# Patient Record
Sex: Female | Born: 1950 | Race: White | Hispanic: No | Marital: Single | Smoking: Never smoker
Health system: Southern US, Community
[De-identification: ages and names within clinical notes are randomized; demographics above are authoritative.]

## PROBLEM LIST (undated history)

## (undated) DIAGNOSIS — T7840XA Allergy, unspecified, initial encounter: Secondary | ICD-10-CM

## (undated) DIAGNOSIS — T4145XA Adverse effect of unspecified anesthetic, initial encounter: Secondary | ICD-10-CM

## (undated) DIAGNOSIS — I10 Essential (primary) hypertension: Secondary | ICD-10-CM

## (undated) DIAGNOSIS — B019 Varicella without complication: Secondary | ICD-10-CM

## (undated) DIAGNOSIS — E785 Hyperlipidemia, unspecified: Secondary | ICD-10-CM

## (undated) DIAGNOSIS — Z9889 Other specified postprocedural states: Secondary | ICD-10-CM

## (undated) DIAGNOSIS — K7689 Other specified diseases of liver: Secondary | ICD-10-CM

## (undated) DIAGNOSIS — R112 Nausea with vomiting, unspecified: Secondary | ICD-10-CM

## (undated) DIAGNOSIS — T8859XA Other complications of anesthesia, initial encounter: Secondary | ICD-10-CM

## (undated) DIAGNOSIS — Z87442 Personal history of urinary calculi: Secondary | ICD-10-CM

## (undated) DIAGNOSIS — N390 Urinary tract infection, site not specified: Secondary | ICD-10-CM

## (undated) DIAGNOSIS — N83209 Unspecified ovarian cyst, unspecified side: Secondary | ICD-10-CM

## (undated) HISTORY — DX: Other specified diseases of liver: K76.89

## (undated) HISTORY — PX: GANGLION CYST EXCISION: SHX1691

## (undated) HISTORY — PX: TOE SURGERY: SHX1073

## (undated) HISTORY — DX: Allergy, unspecified, initial encounter: T78.40XA

## (undated) HISTORY — DX: Urinary tract infection, site not specified: N39.0

## (undated) HISTORY — DX: Hyperlipidemia, unspecified: E78.5

## (undated) HISTORY — PX: TONSILLECTOMY AND ADENOIDECTOMY: SUR1326

## (undated) HISTORY — DX: Unspecified ovarian cyst, unspecified side: N83.209

## (undated) HISTORY — DX: Essential (primary) hypertension: I10

## (undated) HISTORY — DX: Varicella without complication: B01.9

## (undated) HISTORY — PX: ANKLE SURGERY: SHX546

---

## 2004-08-12 ENCOUNTER — Ambulatory Visit: Payer: Self-pay

## 2005-08-19 LAB — HM DEXA SCAN: HM Dexa Scan: NORMAL

## 2005-08-27 ENCOUNTER — Ambulatory Visit: Payer: Self-pay

## 2006-09-02 ENCOUNTER — Ambulatory Visit: Payer: Self-pay

## 2007-04-11 ENCOUNTER — Ambulatory Visit: Payer: Self-pay | Admitting: Urology

## 2014-06-11 ENCOUNTER — Ambulatory Visit: Payer: Self-pay | Admitting: Gastroenterology

## 2014-06-11 LAB — HM COLONOSCOPY

## 2015-09-10 ENCOUNTER — Ambulatory Visit (INDEPENDENT_AMBULATORY_CARE_PROVIDER_SITE_OTHER): Payer: PRIVATE HEALTH INSURANCE | Admitting: Sports Medicine

## 2015-09-10 DIAGNOSIS — M779 Enthesopathy, unspecified: Secondary | ICD-10-CM

## 2015-09-10 DIAGNOSIS — R600 Localized edema: Secondary | ICD-10-CM | POA: Diagnosis not present

## 2015-09-10 DIAGNOSIS — M8430XA Stress fracture, unspecified site, initial encounter for fracture: Secondary | ICD-10-CM

## 2015-09-10 DIAGNOSIS — S93602A Unspecified sprain of left foot, initial encounter: Secondary | ICD-10-CM

## 2015-09-10 NOTE — Progress Notes (Deleted)
   Subjective:    Patient ID: Sandra Richardson, female    DOB: [birth year], 64 y.o.   MRN: [id number]  HPI    Review of Systems  All other systems reviewed and are negative.      Objective:   Physical Exam        Assessment & Plan:

## 2015-09-10 NOTE — Progress Notes (Signed)
Patient ID: MISTI TOWLE, female   DOB: [birth year], 64 y.o.   MRN: [id number] Subjective: Sandra Richardson is a 64 y.o. female patient who presents to office for evaluation of Left foot pain. Patient complains of progressive pain especially over the last 3-4 weeks in the Left foot at the top outer portion of the foot. Reports swelling and pain with walking especially with attempting to ambulate up steps. Reports that the pain is a variation of dull and sharp. States that she has tried motrin and elevation with a little relief; feels much better when she wears her boots. Denies injury/trip/fall/sprain/any causative factors; states she just remembers the pain happening after a [place] trip [place]. Patient denies any other pedal complaints.  Review of Systems  All other systems reviewed and are negative.  There are no active problems to display for this patient.  No current outpatient prescriptions on file prior to visit.   No current facility-administered medications on file prior to visit.   Allergies  Allergen Reactions  . Diclofenac Sodium Rash    Objective:  General: Alert and oriented x3 in no acute distress  Dermatology: No open lesions bilateral lower extremities, no webspace macerations, no ecchymosis bilateral, all nails x 10 are well manicured.  Vascular: Dorsalis Pedis and Posterior Tibial pedal pulses 2/4, Capillary Fill Time 3 seconds,(+) pedal hair growth bilateral, mild focal edema to left dorsolateral foot, no other areas of edema bilateral lower extremities, Temperature gradient within normal limits.  Neurology: Gross sensation intact via light touch bilateral, Protective sensation intact  with Semmes Weinstein Monofilament to all pedal sites, Position sense intact, vibratory intact bilateral, Deep tendon reflexes within normal limits bilateral, No babinski sign present bilateral. (- )Tinels sign left foot.   Musculoskeletal: Tenderness with tuning fork and palpation at  midtarsal joint at base 4-5 metatarsal on left, pain with stressing midtarsal joint on left, palpable boney irregularities at midfoot on Left, No pain with calf compression bilateral. Range of motion is within normal limits in all joints except at left midtarsal joint and with extending 4-5 digits on left at extensor tendons with pain and limitation. Adductus foot type. Strength within normal limits in all groups bilateral.   Gait: Unassisted, Mildly antalgic gait.   Xrays  Exam Info:  [id number] UN 08/12/15 11:42:[id number] [facility]) : XR FOOT 3 OR MORE VIEWS LEFT  EXAM: FOOT COMPLETE LEFT  AP, oblique and lateral views of the left foot are presented for interpretation 08/12/15. No prior study.  CLINICAL INDICATIONS: 64 year old F. R52 - Pain, , .  Bone density appears normal. A question of slight joint space narrowing and subtle marginal degenerative spurring at the first through third TMT joints. No fracture nor subluxation is seen. Mild soft tissue swelling is questioned at the dorsum of the midfoot. There is a small plantar calcaneal heel spur. Remaining articular cartilage spaces about the foot are preserved. No erosions, chondrocalcinosis, fracture, or dislocation is seen.  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, left foot   Assessment and Plan: Problem List Items Addressed This Visit    None    Visit Diagnoses    Tendonitis    -  Primary    Left foot    Sprain of foot, left, initial encounter        Midtarsal joint    Stress fracture, initial encounter        Possible Left 4-5 met base  Localized edema        Left dorsolateral foot        -Complete examination performed -Xray report 08/12/15 from [facility] reviewed  -Discussed treatment options -Applied unna boot to left foot and instructed patient to keep clean, dry, intact for 1 week -Dispensed post op shoe to continue with at all times  when ambulating -Recommend protection, ice, elevation, motrin or tylenol as needed -Patient to return to office in 3 weeks or sooner if condition worsens. Will consider repeat xray if symptoms not improved.   Landis Martins, DPM

## 2015-10-01 ENCOUNTER — Encounter: Payer: Self-pay | Admitting: Sports Medicine

## 2015-10-01 ENCOUNTER — Ambulatory Visit (INDEPENDENT_AMBULATORY_CARE_PROVIDER_SITE_OTHER): Payer: PRIVATE HEALTH INSURANCE | Admitting: Sports Medicine

## 2015-10-01 DIAGNOSIS — R600 Localized edema: Secondary | ICD-10-CM

## 2015-10-01 DIAGNOSIS — S93601D Unspecified sprain of right foot, subsequent encounter: Secondary | ICD-10-CM | POA: Diagnosis not present

## 2015-10-01 DIAGNOSIS — M779 Enthesopathy, unspecified: Secondary | ICD-10-CM

## 2015-10-01 DIAGNOSIS — M8430XD Stress fracture, unspecified site, subsequent encounter for fracture with routine healing: Secondary | ICD-10-CM

## 2015-10-01 NOTE — Progress Notes (Signed)
Patient ID: DLILA GALANIS, female   DOB: 04/04/1951, 64 y.o.   MRN: [id number]  Subjective: CHUA SAVOY is a 64 y.o. female patient returns to office for evaluation of Left foot pain. Patient states that pain in foot is gone. Patient wore unna boot for 1 week with improvement in pain and swelling. Patient also has been using post op shoe with no problems. Patient states that the area is feeling much better with no pain. Patient denies any other pedal complaints.  Review of Systems  All other systems reviewed and are negative.  There are no active problems to display for this patient.  Current Outpatient Prescriptions on File Prior to Visit  Medication Sig Dispense Refill  . amLODipine-benazepril (LOTREL) 10-20 MG capsule Take by mouth.    . Calcium Carbonate-Vitamin D (CALCIUM 500 + D) 500-125 MG-UNIT TABS Take by mouth.    . Cyanocobalamin (VITAMIN B-12 CR) 1000 MCG TBCR Take by mouth.    . polyethylene glycol-electrolytes (NULYTELY/GOLYTELY) 420 G solution Take by mouth.     No current facility-administered medications on file prior to visit.   Allergies  Allergen Reactions  . Diclofenac Sodium Rash    Objective:  General: Alert and oriented x3 in no acute distress  Dermatology: No open lesions bilateral lower extremities, no webspace macerations, no ecchymosis bilateral, all nails x 10 are well manicured.  Vascular: Dorsalis Pedis and Posterior Tibial pedal pulses 2/4, Capillary Fill Time 3 seconds,(+) pedal hair growth bilateral, decreased focal edema to left dorsolateral foot, no other areas of edema bilateral lower extremities, Temperature gradient within normal limits.  Neurology: Gross sensation intact via light touch bilateral, Protective sensation intact  with Semmes Weinstein Monofilament to all pedal sites, Position sense intact, vibratory intact bilateral, Deep tendon reflexes within normal limits bilateral, No babinski sign present bilateral. (- )Tinels sign left foot.    Musculoskeletal: No tenderness with tuning fork and palpation at midtarsal joint at base 4-5 metatarsal on left, No pain with stressing midtarsal joint on left, palpable boney irregularities at midfoot on Left, No pain with calf compression bilateral. Range of motion is within normal limits in all joints with no pain and limitation. Adductus foot type. Strength within normal limits in all groups bilateral.     Assessment and Plan: Problem List Items Addressed This Visit    None    Visit Diagnoses    Sprain of foot, right, subsequent encounter    -  Primary    Tendonitis        Stress fracture, with routine healing, subsequent encounter        Localized edema           -Complete examination performed; No reproducible pain on today's exam.  -Dispensed to patient compression anklet to give support and to continue to reduce edema at right foot; Advised patient to wear anklet for 1 week with transition back to normal shoes -Advised patient to monitor for recurrence; If recurs to use ice and to go back into post op shoe and to come to office for appointment for re-eval -Patient to return as needed or sooner if condition worsens.  Landis Martins, DPM

## 2015-10-22 ENCOUNTER — Encounter: Payer: Self-pay | Admitting: Physician Assistant

## 2015-10-22 ENCOUNTER — Ambulatory Visit: Payer: Self-pay | Admitting: Physician Assistant

## 2015-10-22 VITALS — BP 150/80 | HR 85 | Temp 97.9°F

## 2015-10-22 DIAGNOSIS — R319 Hematuria, unspecified: Principal | ICD-10-CM

## 2015-10-22 DIAGNOSIS — N390 Urinary tract infection, site not specified: Secondary | ICD-10-CM

## 2015-10-22 LAB — POCT URINALYSIS DIPSTICK
BILIRUBIN UA: NEGATIVE
Glucose, UA: NEGATIVE
NITRITE UA: NEGATIVE
PH UA: 5.5
PROTEIN UA: NEGATIVE
Spec Grav, UA: >=1.03
Urobilinogen, UA: 0.2

## 2015-10-22 MED ORDER — CIPROFLOXACIN HCL 250 MG PO TABS: 250.0000 mg | ORAL_TABLET | Freq: Two times a day (BID) | ORAL | Status: DC

## 2015-10-22 NOTE — Progress Notes (Signed)
S:  C/o uti sx for 2 days, saw blood in urine, has urgency, frequency, and bladder pressure, a little low back pain;  denies vaginal discharge, abdominal pain or flank pain:  Remainder ros neg  O:  Vitals wnl, nad, no cva tenderness, back nontender, lungs c t a,cv rrr, abd soft nontender, bs normal, n/v intact; ua +2 blood, 1+ leuks  A: uti  P: cipro 250mg  bid x 7d, increase water intake, add cranberry juice, return if not improving in 2 -3 days, return earlier if worsening, discussed pyelonephritis sx

## 2015-10-30 ENCOUNTER — Encounter: Payer: Self-pay | Admitting: Physician Assistant

## 2015-10-30 ENCOUNTER — Ambulatory Visit: Payer: Self-pay | Admitting: Physician Assistant

## 2015-10-30 VITALS — BP 120/70 | HR 85 | Temp 98.4°F

## 2015-10-30 DIAGNOSIS — N3001 Acute cystitis with hematuria: Secondary | ICD-10-CM

## 2015-10-30 LAB — POCT URINALYSIS DIPSTICK
BILIRUBIN UA: NEGATIVE
Glucose, UA: NEGATIVE
Ketones, UA: NEGATIVE
NITRITE UA: NEGATIVE
PH UA: 5.5
PROTEIN UA: NEGATIVE
SPEC GRAV UA: 1.02
UROBILINOGEN UA: 0.2

## 2015-10-30 MED ORDER — SULFAMETHOXAZOLE-TRIMETHOPRIM 800-160 MG PO TABS: 1.0000 | ORAL_TABLET | Freq: Two times a day (BID) | ORAL | Status: DC

## 2015-10-30 NOTE — Progress Notes (Signed)
S: here for recheck of urine, says still going freq, feels better but not completely well, had a uti that was hard to clear up and ended up going to urology  O: vitals wnl, nad, no cva tenderness, ua 1+ leuks, 2+ blood  A: recurrent uti  P: septra ds 1 po bid x 7d, urine culture

## 2015-11-01 ENCOUNTER — Telehealth: Payer: Self-pay | Admitting: Emergency Medicine

## 2015-11-01 LAB — URINE CULTURE

## 2015-11-01 MED ORDER — FLUCONAZOLE 150 MG PO TABS: 150.0000 mg | ORAL_TABLET | Freq: Once | ORAL | Status: DC

## 2015-11-01 NOTE — Progress Notes (Signed)
Left message on patient's voice mail to return my call.

## 2015-11-01 NOTE — Progress Notes (Signed)
I spoke with the patient about her lab results and she expressed understanding.  Patient will return to the clinic to recheck her urine once she is done with the antibiotics.

## 2015-11-01 NOTE — Telephone Encounter (Signed)
Diflucan, pt is on 2nd round of antibiotics for uti

## 2015-11-08 ENCOUNTER — Ambulatory Visit: Payer: Self-pay | Admitting: Physician Assistant

## 2015-11-08 ENCOUNTER — Encounter: Payer: Self-pay | Admitting: Physician Assistant

## 2015-11-08 VITALS — BP 130/70 | HR 84 | Temp 98.5°F

## 2015-11-08 DIAGNOSIS — R319 Hematuria, unspecified: Secondary | ICD-10-CM

## 2015-11-08 LAB — POCT URINALYSIS DIPSTICK
BILIRUBIN UA: NEGATIVE
GLUCOSE UA: NEGATIVE
KETONES UA: NEGATIVE
Nitrite, UA: NEGATIVE
PH UA: 5.5
Protein, UA: NEGATIVE
Spec Grav, UA: 1.02
Urobilinogen, UA: 0.2

## 2015-11-08 NOTE — Addendum Note (Signed)
Addended by: Rudene Anda T on: 11/08/2015 09:09 AM   Modules accepted: Orders

## 2015-11-08 NOTE — Progress Notes (Signed)
S: here for recheck of urine, no fever/chills, states pressure is gone but still sees blood with urination, denies vag bleeding or discharge, has seen dr cope before for similar problems  O: vitals wnl, nad, ua trace leuks, 2+blood  A: hematuria  P: refer to dr cope, urine culture on last urine sample one week ago was normal, pt has had cipro and septra; still has leuks and blood in urine

## 2016-04-30 ENCOUNTER — Encounter: Payer: Self-pay | Admitting: Physician Assistant

## 2016-04-30 ENCOUNTER — Ambulatory Visit: Payer: Self-pay | Admitting: Physician Assistant

## 2016-04-30 VITALS — BP 140/70 | HR 80 | Temp 97.4°F

## 2016-04-30 DIAGNOSIS — R252 Cramp and spasm: Secondary | ICD-10-CM

## 2016-04-30 DIAGNOSIS — I10 Essential (primary) hypertension: Secondary | ICD-10-CM

## 2016-04-30 MED ORDER — AMLODIPINE BESY-BENAZEPRIL HCL 10-20 MG PO CAPS: 1.0000 | ORAL_CAPSULE | Freq: Every day | ORAL | Status: DC

## 2016-04-30 NOTE — Progress Notes (Signed)
S: c/o leg cramps for last 3 days, changes positions in her legs, near ankle, in calf, also her pcp died and needs a new doctor, is running out of her bp meds, denies cp/sob  O: vitals wnl, nad, lungs c t a, cv rrr, legs nontender, no pedal edema noted  A: htn, leg cramps  P; refill of lotrel, labs for met b and magnesium

## 2016-05-01 LAB — MAGNESIUM: MAGNESIUM: 2.2 mg/dL (ref 1.6–2.3)

## 2016-05-01 LAB — BASIC METABOLIC PANEL
BUN/Creatinine Ratio: 22 (ref 12–28)
BUN: 19 mg/dL (ref 8–27)
CALCIUM: 9.7 mg/dL (ref 8.7–10.3)
CO2: 20 mmol/L (ref 18–29)
CREATININE: 0.88 mg/dL (ref 0.57–1.00)
Chloride: 100 mmol/L (ref 96–106)
GFR calc Af Amer: 80 mL/min/{1.73_m2} (ref >59)
GFR calc non Af Amer: 70 mL/min/{1.73_m2} (ref >59)
GLUCOSE: 98 mg/dL (ref 65–99)
Potassium: 4.1 mmol/L (ref 3.5–5.2)
Sodium: 137 mmol/L (ref 134–144)

## 2016-08-29 ENCOUNTER — Other Ambulatory Visit: Payer: Self-pay | Admitting: Physician Assistant

## 2016-08-29 DIAGNOSIS — I10 Essential (primary) hypertension: Secondary | ICD-10-CM

## 2016-08-31 NOTE — Telephone Encounter (Signed)
Med refill approved, pt will need fasting labs prior to additonal refills if wants to continue getting her medication here, or send to pcp if is getting her labs done there

## 2016-12-08 ENCOUNTER — Ambulatory Visit: Payer: Self-pay | Admitting: Physician Assistant

## 2016-12-08 ENCOUNTER — Encounter: Payer: Self-pay | Admitting: Physician Assistant

## 2016-12-08 VITALS — BP 139/80 | HR 80 | Temp 98.2°F

## 2016-12-08 DIAGNOSIS — M5410 Radiculopathy, site unspecified: Secondary | ICD-10-CM

## 2016-12-08 MED ORDER — METHYLPREDNISOLONE 4 MG PO TBPK: ORAL_TABLET | ORAL | 0 refills | Status: DC

## 2016-12-08 NOTE — Progress Notes (Signed)
   Subjective:Right hip pain    Patient ID: Sandra Richardson, female    DOB: [birth year], 66 y.o.   MRN: [id number]  HPI Patient c/o right buttock pain radiating to to right med posterior thigh for 34 days. No history of injury. Denies Bladder or Bowel dysfunction. Patient describes the painas'Sharp/burnign". No palliative measures for compliant. Patient is morbid obese.   Review of Systems    Hypertension. Objective:   Physical Exam Patient habitus limits exam. No obvious spinal deformity. No guarding with palpation of spinal process. Sit/stand without support of upper extremities. Negative straight leg test.       Assessment & Plan:Radicular back pain to right lower extremity  Trial of prednisone and schedule follow up in one week.

## 2017-03-29 ENCOUNTER — Other Ambulatory Visit: Payer: Self-pay | Admitting: Physician Assistant

## 2017-03-29 DIAGNOSIS — I10 Essential (primary) hypertension: Secondary | ICD-10-CM

## 2017-03-29 NOTE — Telephone Encounter (Signed)
Med refill for amlodipine-benazepril approved, pt needs fasting labs prior to additional refills

## 2017-05-19 ENCOUNTER — Ambulatory Visit: Payer: Self-pay | Admitting: Physician Assistant

## 2017-05-19 DIAGNOSIS — Z299 Encounter for prophylactic measures, unspecified: Secondary | ICD-10-CM

## 2017-05-19 NOTE — Progress Notes (Signed)
Patient came in to have blood drawn for testing per Susan's authorization for additional medication refills for her blood pressure medication.

## 2017-05-20 ENCOUNTER — Other Ambulatory Visit: Payer: Self-pay | Admitting: Physician Assistant

## 2017-05-20 DIAGNOSIS — I10 Essential (primary) hypertension: Secondary | ICD-10-CM

## 2017-05-20 LAB — CMP12+LP+TP+TSH+6AC+CBC/D/PLT
ALT: 13 IU/L (ref 0–32)
AST: 19 IU/L (ref 0–40)
Albumin/Globulin Ratio: 1.7 (ref 1.2–2.2)
Albumin: 4.4 g/dL (ref 3.6–4.8)
Alkaline Phosphatase: 66 IU/L (ref 39–117)
BASOS ABS: 0.1 10*3/uL (ref 0.0–0.2)
BILIRUBIN TOTAL: 0.6 mg/dL (ref 0.0–1.2)
BUN/Creatinine Ratio: 17 (ref 12–28)
BUN: 17 mg/dL (ref 8–27)
Basos: 1 %
CALCIUM: 10 mg/dL (ref 8.7–10.3)
CHOLESTEROL TOTAL: 241 mg/dL — AB (ref 100–199)
Chloride: 101 mmol/L (ref 96–106)
Chol/HDL Ratio: 4.3 ratio (ref 0.0–4.4)
Creatinine, Ser: 0.98 mg/dL (ref 0.57–1.00)
EOS (ABSOLUTE): 0.2 10*3/uL (ref 0.0–0.4)
Eos: 3 %
Estimated CHD Risk: 1 times avg. (ref 0.0–1.0)
FREE THYROXINE INDEX: 2.3 (ref 1.2–4.9)
GFR calc Af Amer: 70 mL/min/{1.73_m2} (ref >59)
GFR calc non Af Amer: 61 mL/min/{1.73_m2} (ref >59)
GGT: 12 IU/L (ref 0–60)
GLOBULIN, TOTAL: 2.6 g/dL (ref 1.5–4.5)
GLUCOSE: 99 mg/dL (ref 65–99)
HDL: 56 mg/dL (ref >39)
Hematocrit: 43.9 % (ref 34.0–46.6)
Hemoglobin: 14.4 g/dL (ref 11.1–15.9)
IMMATURE GRANULOCYTES: 0 %
IRON: 122 ug/dL (ref 27–139)
Immature Grans (Abs): 0 10*3/uL (ref 0.0–0.1)
LDH: 207 IU/L (ref 119–226)
LDL Calculated: 164 mg/dL — ABNORMAL HIGH (ref 0–99)
Lymphocytes Absolute: 2 10*3/uL (ref 0.7–3.1)
Lymphs: 31 %
MCH: 28 pg (ref 26.6–33.0)
MCHC: 32.8 g/dL (ref 31.5–35.7)
MCV: 85 fL (ref 79–97)
MONOS ABS: 0.7 10*3/uL (ref 0.1–0.9)
Monocytes: 11 %
NEUTROS PCT: 54 %
Neutrophils Absolute: 3.4 10*3/uL (ref 1.4–7.0)
PHOSPHORUS: 3.9 mg/dL (ref 2.5–4.5)
PLATELETS: 343 10*3/uL (ref 150–379)
Potassium: 5.4 mmol/L — ABNORMAL HIGH (ref 3.5–5.2)
RBC: 5.14 x10E6/uL (ref 3.77–5.28)
RDW: 13.3 % (ref 12.3–15.4)
Sodium: 139 mmol/L (ref 134–144)
T3 UPTAKE RATIO: 29 % (ref 24–39)
T4, Total: 7.9 ug/dL (ref 4.5–12.0)
TSH: 3.91 u[IU]/mL (ref 0.450–4.500)
Total Protein: 7 g/dL (ref 6.0–8.5)
Triglycerides: 106 mg/dL (ref 0–149)
Uric Acid: 6.2 mg/dL (ref 2.5–7.1)
VLDL CHOLESTEROL CAL: 21 mg/dL (ref 5–40)
WBC: 6.3 10*3/uL (ref 3.4–10.8)

## 2017-05-20 LAB — VITAMIN D 25 HYDROXY (VIT D DEFICIENCY, FRACTURES): VIT D 25 HYDROXY: 23.9 ng/mL — AB (ref 30.0–100.0)

## 2017-05-20 MED ORDER — AMLODIPINE BESY-BENAZEPRIL HCL 10-20 MG PO CAPS: 1.0000 | ORAL_CAPSULE | Freq: Every day | ORAL | 6 refills | Status: DC

## 2017-05-20 NOTE — Progress Notes (Unsigned)
Sent rx for lotrel to [facility] for patient, labs are normal even though the potassium is a little high, recheck potassium in 1 month, pt to stay away from foods high in potassium

## 2017-09-28 ENCOUNTER — Ambulatory Visit: Payer: Self-pay | Admitting: Physician Assistant

## 2017-09-28 ENCOUNTER — Encounter: Payer: Self-pay | Admitting: Physician Assistant

## 2017-09-28 VITALS — BP 130/80 | HR 82 | Temp 98.5°F | Resp 16

## 2017-09-28 DIAGNOSIS — R232 Flushing: Secondary | ICD-10-CM

## 2017-09-28 NOTE — Progress Notes (Signed)
S: Patient complains of facial flushing, was worried her blood pressure was elevated, states she did have a little bit of a sinus headache, but no fever or chills, no chest pain or shortness of breath, no new medications or foods  O: Vitals are normal, ENT is normal, lungs clear to auscultation, heart sounds are normal, skin is flushed around the neck and face across both cheeks, neurovascular is intact  A: Facial flushing  P: Reassurance, patient has history of rosacea, she should follow-up with her regular doctor, return if worsening

## 2018-03-10 ENCOUNTER — Encounter: Payer: Self-pay | Admitting: Internal Medicine

## 2018-03-10 ENCOUNTER — Ambulatory Visit (INDEPENDENT_AMBULATORY_CARE_PROVIDER_SITE_OTHER): Payer: Managed Care, Other (non HMO) | Admitting: Internal Medicine

## 2018-03-10 VITALS — BP 134/74 | HR 75 | Temp 98.2°F | Ht 64.0 in | Wt 277.6 lb

## 2018-03-10 DIAGNOSIS — Z1389 Encounter for screening for other disorder: Secondary | ICD-10-CM | POA: Diagnosis not present

## 2018-03-10 DIAGNOSIS — E559 Vitamin D deficiency, unspecified: Secondary | ICD-10-CM | POA: Diagnosis not present

## 2018-03-10 DIAGNOSIS — E2839 Other primary ovarian failure: Secondary | ICD-10-CM | POA: Diagnosis not present

## 2018-03-10 DIAGNOSIS — M5431 Sciatica, right side: Secondary | ICD-10-CM

## 2018-03-10 DIAGNOSIS — Z23 Encounter for immunization: Secondary | ICD-10-CM

## 2018-03-10 DIAGNOSIS — Z1329 Encounter for screening for other suspected endocrine disorder: Secondary | ICD-10-CM | POA: Diagnosis not present

## 2018-03-10 DIAGNOSIS — I10 Essential (primary) hypertension: Secondary | ICD-10-CM | POA: Diagnosis not present

## 2018-03-10 DIAGNOSIS — R600 Localized edema: Secondary | ICD-10-CM | POA: Diagnosis not present

## 2018-03-10 DIAGNOSIS — E785 Hyperlipidemia, unspecified: Secondary | ICD-10-CM | POA: Diagnosis not present

## 2018-03-10 DIAGNOSIS — R7303 Prediabetes: Secondary | ICD-10-CM

## 2018-03-10 MED ORDER — AMLODIPINE BESY-BENAZEPRIL HCL 10-20 MG PO CAPS: 1.0000 | ORAL_CAPSULE | Freq: Every day | ORAL | 1 refills | Status: DC

## 2018-03-10 NOTE — Progress Notes (Signed)
Chief Complaint  Patient presents with  . New Patient (Initial Visit)   New patient  1. HTN controlled on lotrel 10-20 mg qd  2. C/o right sciatic pain occasionally no recent low back Xray  3. C/o chronic right lower foot swelling since ankle surgery/foot surgery    Review of Systems  Constitutional: Negative for weight loss.  HENT: Negative for hearing loss.   Eyes: Negative for blurred vision.  Respiratory: Negative for shortness of breath.   Cardiovascular: Positive for leg swelling. Negative for chest pain.  Gastrointestinal: Negative for abdominal pain.  Musculoskeletal: Negative for falls.       +right sciatica intermittently   Skin: Negative for rash.  Neurological: Negative for headaches.  Psychiatric/Behavioral: Negative for depression.   Past Medical History:  Diagnosis Date  . Allergy   . Chicken pox   . Hyperlipidemia   . Hypertension   . Liver cyst   . Ovarian cyst    1979  . UTI (urinary tract infection)    Past Surgical History:  Procedure Laterality Date  . ANKLE SURGERY     1987  . GANGLION CYST EXCISION     70s/80s  . TOE SURGERY     R great toe late 1990s  . TONSILLECTOMY AND ADENOIDECTOMY     1972   Family History  Problem Relation Age of Onset  . Heart disease Mother   . Hypertension Mother   . Hyperthyroidism Mother   . Heart disease Father   . COPD Father   . Arthritis Sister   . Depression Sister   . Hypertension Sister   . Hypothyroidism Sister   . Hyperthyroidism Brother        s/p ablation   . Stroke Maternal Grandmother   . Pneumonia Maternal Grandfather   . Cancer Paternal Grandmother   . Heart disease Paternal Grandfather   . Cancer Brother    Social History   Socioeconomic History  . Marital status: Single    Spouse name: Not on file  . Number of children: Not on file  . Years of education: Not on file  . Highest education level: Not on file  Occupational History  . Not on file  Social Needs  . Financial  resource strain: Not on file  . Food insecurity:    Worry: Not on file    Inability: Not on file  . Transportation needs:    Medical: Not on file    Non-medical: Not on file  Tobacco Use  . Smoking status: Never Smoker  . Smokeless tobacco: Never Used  Substance and Sexual Activity  . Alcohol use: Not Currently    Alcohol/week: 0.0 oz  . Drug use: Not Currently  . Sexual activity: Not Currently  Lifestyle  . Physical activity:    Days per week: Not on file    Minutes per session: Not on file  . Stress: Not on file  Relationships  . Social connections:    Talks on phone: Not on file    Gets together: Not on file    Attends religious service: Not on file    Active member of club or organization: Not on file    Attends meetings of clubs or organizations: Not on file    Relationship status: Not on file  . Intimate partner violence:    Fear of current or ex partner: Not on file    Emotionally abused: Not on file    Physically abused: Not on file  Forced sexual activity: Not on file  Other Topics Concern  . Not on file  Social History Narrative   No kids    Owns guns    Wears seat belts    Safe in relationship    [employer] ed    [employer] job    Current Meds  Medication Sig  . amLODipine-benazepril (LOTREL) 10-20 MG capsule Take 1 capsule by mouth daily.  . Calcium Carb-Cholecalciferol (CALCIUM CARBONATE-VITAMIN D3 PO) Take 1,000 Units by mouth.   . [DISCONTINUED] amLODipine-benazepril (LOTREL) 10-20 MG capsule Take 1 capsule by mouth daily.   Allergies  Allergen Reactions  . Diclofenac Sodium Rash   No results found for this or any previous visit (from the past 2160 hour(s)). Objective  Body mass index is 47.65 kg/m. Wt Readings from Last 3 Encounters:  03/10/18 277 lb 9.6 oz (125.9 kg)   Temp Readings from Last 3 Encounters:  03/10/18 98.2 F (36.8 C) (Oral)  09/28/17 98.5 F (36.9 C) (Oral)  12/08/16 98.2 F (36.8 C) (Oral)   BP Readings from Last 3  Encounters:  03/10/18 134/74  09/28/17 130/80  12/08/16 139/80   Pulse Readings from Last 3 Encounters:  03/10/18 75  09/28/17 82  12/08/16 80    Physical Exam  Constitutional: She is oriented to person, place, and time. Vital signs are normal. She appears well-developed and well-nourished. She is cooperative.  HENT:  Head: Normocephalic and atraumatic.  Mouth/Throat: Oropharynx is clear and moist and mucous membranes are normal.  Eyes: Pupils are equal, round, and reactive to light. Conjunctivae are normal.  Cardiovascular: Normal rate, regular rhythm and normal heart sounds.  Pulmonary/Chest: Effort normal and breath sounds normal.  Neurological: She is alert and oriented to person, place, and time. Gait normal.  Skin: Skin is warm, dry and intact.  Psychiatric: She has a normal mood and affect. Her speech is normal and behavior is normal. Judgment and thought content normal. Cognition and memory are normal.  Nursing note and vitals reviewed.   Assessment   1. HTN  2. Right sciatic pain chronic  3. Obesity >47  4. Chronic right leg edema 5. HM Plan   1. Refilled medication  Labs fasting [facility] order given  2. Consider xray in future if continues to bother  3. rec execise and healthy diet choices  4. Monitor no alarm sx's  5. HM Flu shot 2018  Tdap 03/10/18 today  Think about prevnar given info not had pna 23  Will disc shingrix in future  Declines HIV, hep B, C teating   colonsocopy 06/11/14 IH/EH fair prep and diverticulosis [facility] rec repeat in 5 years  DEXA 08/19/05 normal, referred repeat age 67 y.o  Pap at f/u  Mammogram will have at work will let me know  Labs today ordered [facility]  Eye Dr. Ellin Mayhew Dentist Dr. Leanor Kail    Provider: Dr. Olivia Mackie McLean-Scocuzza-Internal Medicine

## 2018-03-10 NOTE — Progress Notes (Signed)
Pre visit review using our clinic review tool, if applicable. No additional management support is needed unless otherwise documented below in the visit note. 

## 2018-03-10 NOTE — Patient Instructions (Addendum)
Let me know about mammogram  We will refer for bone density  Think about prevnar  Follow up in 3-4 months  Please get fasting labs before next appt no food only meds x 12 hours  We need to do pap at follow up    Pneumococcal Conjugate Vaccine suspension for injection What is this medicine? PNEUMOCOCCAL VACCINE (NEU mo KOK al vak SEEN) is a vaccine used to prevent pneumococcus bacterial infections. These bacteria can cause serious infections like pneumonia, meningitis, and blood infections. This vaccine will lower your chance of getting pneumonia. If you do get pneumonia, it can make your symptoms milder and your illness shorter. This vaccine will not treat an infection and will not cause infection. This vaccine is recommended for infants and young children, adults with certain medical conditions, and adults 62 years or older. This medicine may be used for other purposes; ask your health care provider or pharmacist if you have questions. COMMON BRAND NAME(S): Prevnar, Prevnar 13 What should I tell my health care provider before I take this medicine? They need to know if you have any of these conditions: -bleeding problems -fever -immune system problems -an unusual or allergic reaction to pneumococcal vaccine, diphtheria toxoid, other vaccines, latex, other medicines, foods, dyes, or preservatives -pregnant or trying to get pregnant -breast-feeding How should I use this medicine? This vaccine is for injection into a muscle. It is given by a health care professional. A copy of Vaccine Information Statements will be given before each vaccination. Read this sheet carefully each time. The sheet may change frequently. Talk to your pediatrician regarding the use of this medicine in children. While this drug may be prescribed for children as young as 69 weeks old for selected conditions, precautions do apply. Overdosage: If you think you have taken too much of this medicine contact a poison control  center or emergency room at once. NOTE: This medicine is only for you. Do not share this medicine with others. What if I miss a dose? It is important not to miss your dose. Call your doctor or health care professional if you are unable to keep an appointment. What may interact with this medicine? -medicines for cancer chemotherapy -medicines that suppress your immune function -steroid medicines like prednisone or cortisone This list may not describe all possible interactions. Give your health care provider a list of all the medicines, herbs, non-prescription drugs, or dietary supplements you use. Also tell them if you smoke, drink alcohol, or use illegal drugs. Some items may interact with your medicine. What should I watch for while using this medicine? Mild fever and pain should go away in 3 days or less. Report any unusual symptoms to your doctor or health care professional. What side effects may I notice from receiving this medicine? Side effects that you should report to your doctor or health care professional as soon as possible: -allergic reactions like skin rash, itching or hives, swelling of the face, lips, or tongue -breathing problems -confused -fast or irregular heartbeat -fever over 102 degrees F -seizures -unusual bleeding or bruising -unusual muscle weakness Side effects that usually do not require medical attention (report to your doctor or health care professional if they continue or are bothersome): -aches and pains -diarrhea -fever of 102 degrees F or less -headache -irritable -loss of appetite -pain, tender at site where injected -trouble sleeping This list may not describe all possible side effects. Call your doctor for medical advice about side effects. You may report side effects  to FDA at 1-800-FDA-1088. Where should I keep my medicine? This does not apply. This vaccine is given in a clinic, pharmacy, doctor's office, or other health care setting and will not  be stored at home. NOTE: This sheet is a summary. It may not cover all possible information. If you have questions about this medicine, talk to your doctor, pharmacist, or health care provider.  2018 Elsevier/Gold Standard (2014-08-02 10:27:27)  Cholesterol Cholesterol is a white, waxy, fat-like substance that is needed by the human body in small amounts. The liver makes all the cholesterol we need. Cholesterol is carried from the liver by the blood through the blood vessels. Deposits of cholesterol (plaques) may build up on blood vessel (artery) walls. Plaques make the arteries narrower and stiffer. Cholesterol plaques increase the risk for heart attack and stroke. You cannot feel your cholesterol level even if it is very high. The only way to know that it is high is to have a blood test. Once you know your cholesterol levels, you should keep a record of the test results. Work with your health care provider to keep your levels in the desired range. What do the results mean?  Total cholesterol is a rough measure of all the cholesterol in your blood.  LDL (low-density lipoprotein) is the "bad" cholesterol. This is the type that causes plaque to build up on the artery walls. You want this level to be low.  HDL (high-density lipoprotein) is the "good" cholesterol because it cleans the arteries and carries the LDL away. You want this level to be high.  Triglycerides are fat that the body can either burn for energy or store. High levels are closely linked to heart disease. What are the desired levels of cholesterol?  Total cholesterol below 200.  LDL below 100 for people who are at risk, below 70 for people at very high risk.  HDL above 40 is good. A level of 60 or higher is considered to be protective against heart disease.  Triglycerides below 150. How can I lower my cholesterol? Diet Follow your diet program as told by your health care provider.  Choose fish or white meat chicken and  [place], roasted or baked. Limit fatty cuts of red meat, fried foods, and processed meats, such as sausage and lunch meats.  Eat lots of fresh fruits and vegetables.  Choose whole grains, beans, pasta, potatoes, and cereals.  Choose olive oil, corn oil, or canola oil, and use only small amounts.  Avoid butter, mayonnaise, shortening, or palm kernel oils.  Avoid foods with trans fats.  Drink skim or nonfat milk and eat low-fat or nonfat yogurt and cheeses. Avoid whole milk, cream, ice cream, egg yolks, and full-fat cheeses.  Healthier desserts include angel food cake, ginger snaps, animal crackers, hard candy, popsicles, and low-fat or nonfat frozen yogurt. Avoid pastries, cakes, pies, and cookies.  Exercise  Follow your exercise program as told by your health care provider. A regular program: ? Helps to decrease LDL and raise HDL. ? Helps with weight control.  Do things that increase your activity level, such as gardening, walking, and taking the stairs.  Ask your health care provider about ways that you can be more active in your daily life.  Medicine  Take over-the-counter and prescription medicines only as told by your health care provider. ? Medicine may be prescribed by your health care provider to help lower cholesterol and decrease the risk for heart disease. This is usually done if diet and exercise  have failed to bring down cholesterol levels. ? If you have several risk factors, you may need medicine even if your levels are normal.  This information is not intended to replace advice given to you by your health care provider. Make sure you discuss any questions you have with your health care provider. Document Released: 07/21/2001 Document Revised: 05/23/2016 Document Reviewed: April 19, 202017 Elsevier Interactive Patient Education  2018 [employer]. Exercising to [employer] Exercising can help you to lose weight. In order to lose weight through exercise, you need to do  vigorous-intensity exercise. You can tell that you are exercising with vigorous intensity if you are breathing very hard and fast and cannot hold a conversation while exercising. Moderate-intensity exercise helps to maintain your current weight. You can tell that you are exercising at a moderate level if you have a higher heart rate and faster breathing, but you are still able to hold a conversation. How often should I exercise? Choose an activity that you enjoy and set realistic goals. Your health care provider can help you to make an activity plan that works for you. Exercise regularly as directed by your health care provider. This may include:  Doing resistance training twice each week, such as: ? Push-ups. ? Sit-ups. ? Lifting weights. ? Using resistance bands.  Doing a given intensity of exercise for a given amount of time. Choose from these options: ? 150 minutes of moderate-intensity exercise every week. ? 75 minutes of vigorous-intensity exercise every week. ? A mix of moderate-intensity and vigorous-intensity exercise every week.  Children, pregnant women, people who are out of shape, people who are overweight, and older adults may need to consult a health care provider for individual recommendations. If you have any sort of medical condition, be sure to consult your health care provider before starting a new exercise program. What are some activities that can help me to lose weight?  Walking at a rate of at least 4.5 miles an hour.  Jogging or running at a rate of 5 miles per hour.  Biking at a rate of at least 10 miles per hour.  Lap swimming.  Roller-skating or in-line skating.  Cross-country skiing.  Vigorous competitive sports, such as football, basketball, and soccer.  Jumping rope.  Aerobic dancing. How can I be more active in my day-to-day activities?  Use the stairs instead of the elevator.  Take a walk during your lunch break.  If you drive, park your car  farther away from work or school.  If you take public transportation, get off one stop early and walk the rest of the way.  Make all of your phone calls while standing up and walking around.  Get up, stretch, and walk around every 30 minutes throughout the day. What guidelines should I follow while exercising?  Do not exercise so much that you hurt yourself, feel dizzy, or get very short of breath.  Consult your health care provider prior to starting a new exercise program.  Wear comfortable clothes and shoes with good support.  Drink plenty of water while you exercise to prevent dehydration or heat stroke. Body water is lost during exercise and must be replaced.  Work out until you breathe faster and your heart beats faster. This information is not intended to replace advice given to you by your health care provider. Make sure you discuss any questions you have with your health care provider. Document Released: 11/28/2010 Document Revised: 04/02/2016 Document Reviewed: 03/29/2014 Elsevier Interactive Patient Education  2018  [employer].

## 2018-03-11 ENCOUNTER — Encounter: Payer: Self-pay | Admitting: Internal Medicine

## 2018-03-11 DIAGNOSIS — R600 Localized edema: Secondary | ICD-10-CM | POA: Insufficient documentation

## 2018-03-28 ENCOUNTER — Other Ambulatory Visit: Payer: Self-pay

## 2018-03-28 DIAGNOSIS — E559 Vitamin D deficiency, unspecified: Secondary | ICD-10-CM

## 2018-03-28 DIAGNOSIS — E785 Hyperlipidemia, unspecified: Secondary | ICD-10-CM

## 2018-03-28 DIAGNOSIS — R7303 Prediabetes: Secondary | ICD-10-CM

## 2018-03-28 DIAGNOSIS — I10 Essential (primary) hypertension: Secondary | ICD-10-CM

## 2018-03-28 DIAGNOSIS — Z1389 Encounter for screening for other disorder: Secondary | ICD-10-CM

## 2018-03-28 DIAGNOSIS — Z1329 Encounter for screening for other suspected endocrine disorder: Secondary | ICD-10-CM

## 2018-03-29 LAB — COMPREHENSIVE METABOLIC PANEL
ALBUMIN: 4.5 g/dL (ref 3.6–4.8)
ALT: 22 IU/L (ref 0–32)
AST: 33 IU/L (ref 0–40)
Albumin/Globulin Ratio: 1.7 (ref 1.2–2.2)
Alkaline Phosphatase: 62 IU/L (ref 39–117)
BILIRUBIN TOTAL: 0.6 mg/dL (ref 0.0–1.2)
BUN/Creatinine Ratio: 19 (ref 12–28)
BUN: 20 mg/dL (ref 8–27)
CHLORIDE: 102 mmol/L (ref 96–106)
CO2: 22 mmol/L (ref 20–29)
Calcium: 9.8 mg/dL (ref 8.7–10.3)
Creatinine, Ser: 1.07 mg/dL — ABNORMAL HIGH (ref 0.57–1.00)
GFR calc non Af Amer: 55 mL/min/{1.73_m2} — ABNORMAL LOW (ref >59)
GFR, EST AFRICAN AMERICAN: 63 mL/min/{1.73_m2} (ref >59)
GLUCOSE: 94 mg/dL (ref 65–99)
Globulin, Total: 2.6 g/dL (ref 1.5–4.5)
Potassium: 5 mmol/L (ref 3.5–5.2)
Sodium: 140 mmol/L (ref 134–144)
TOTAL PROTEIN: 7.1 g/dL (ref 6.0–8.5)

## 2018-03-29 LAB — MICROSCOPIC EXAMINATION: CASTS: NONE SEEN /LPF

## 2018-03-29 LAB — VITAMIN D 25 HYDROXY (VIT D DEFICIENCY, FRACTURES): Vit D, 25-Hydroxy: 26.7 ng/mL — ABNORMAL LOW (ref 30.0–100.0)

## 2018-03-29 LAB — CBC WITH DIFFERENTIAL/PLATELET
BASOS ABS: 0 10*3/uL (ref 0.0–0.2)
Basos: 1 %
EOS (ABSOLUTE): 0.1 10*3/uL (ref 0.0–0.4)
Eos: 1 %
HEMOGLOBIN: 15 g/dL (ref 11.1–15.9)
Hematocrit: 45 % (ref 34.0–46.6)
Immature Grans (Abs): 0 10*3/uL (ref 0.0–0.1)
Immature Granulocytes: 0 %
LYMPHS ABS: 1.9 10*3/uL (ref 0.7–3.1)
Lymphs: 27 %
MCH: 28.7 pg (ref 26.6–33.0)
MCHC: 33.3 g/dL (ref 31.5–35.7)
MCV: 86 fL (ref 79–97)
MONOS ABS: 0.7 10*3/uL (ref 0.1–0.9)
Monocytes: 10 %
NEUTROS ABS: 4.3 10*3/uL (ref 1.4–7.0)
Neutrophils: 61 %
Platelets: 392 10*3/uL (ref 150–450)
RBC: 5.23 x10E6/uL (ref 3.77–5.28)
RDW: 13.2 % (ref 12.3–15.4)
WBC: 7 10*3/uL (ref 3.4–10.8)

## 2018-03-29 LAB — URINALYSIS, ROUTINE W REFLEX MICROSCOPIC
BILIRUBIN UA: NEGATIVE
GLUCOSE, UA: NEGATIVE
KETONES UA: NEGATIVE
Nitrite, UA: NEGATIVE
PROTEIN UA: NEGATIVE
SPEC GRAV UA: 1.024 (ref 1.005–1.030)
Urobilinogen, Ur: 0.2 mg/dL (ref 0.2–1.0)
pH, UA: 5 (ref 5.0–7.5)

## 2018-03-29 LAB — TSH: TSH: 3.6 u[IU]/mL (ref 0.450–4.500)

## 2018-03-29 LAB — HGB A1C W/O EAG: Hgb A1c MFr Bld: 5.8 % — ABNORMAL HIGH (ref 4.8–5.6)

## 2018-03-29 LAB — LIPID PANEL
CHOLESTEROL TOTAL: 234 mg/dL — AB (ref 100–199)
Chol/HDL Ratio: 4.3 ratio (ref 0.0–4.4)
HDL: 55 mg/dL (ref >39)
LDL CALC: 158 mg/dL — AB (ref 0–99)
Triglycerides: 107 mg/dL (ref 0–149)
VLDL CHOLESTEROL CAL: 21 mg/dL (ref 5–40)

## 2018-04-02 ENCOUNTER — Other Ambulatory Visit: Payer: Self-pay | Admitting: Internal Medicine

## 2018-04-02 DIAGNOSIS — E785 Hyperlipidemia, unspecified: Secondary | ICD-10-CM

## 2018-04-02 MED ORDER — ATORVASTATIN CALCIUM 20 MG PO TABS: 20.0000 mg | ORAL_TABLET | Freq: Every day | ORAL | 3 refills | Status: DC

## 2018-04-21 ENCOUNTER — Telehealth: Payer: Self-pay | Admitting: Internal Medicine

## 2018-04-21 NOTE — Telephone Encounter (Signed)
Paperwork has been placed in provider forms folder.

## 2018-04-21 NOTE — Telephone Encounter (Signed)
PT dropped off a health form that needs to be completed by the end of this month. Pt would like to pick up form when ready, she has given her work phone number on form. Paper is up front in Dr. Gentry Roch colored folder.

## 2018-04-29 ENCOUNTER — Ambulatory Visit
Admission: RE | Admit: 2018-04-29 | Discharge: 2018-04-29 | Disposition: A | Discharge status: Home / Self Care | Payer: Managed Care, Other (non HMO) | Source: Ambulatory Visit | Attending: Internal Medicine | Admitting: Internal Medicine

## 2018-04-29 ENCOUNTER — Encounter: Payer: Self-pay | Admitting: Radiology

## 2018-04-29 DIAGNOSIS — E2839 Other primary ovarian failure: Secondary | ICD-10-CM | POA: Diagnosis not present

## 2018-05-02 NOTE — Telephone Encounter (Signed)
Copied from [place] [id number]. Topic: Quick Communication - See Telephone Encounter >> May 02, 2018  3:57 PM Percell Belt A wrote: CRM for notification. See Telephone encounter for: 05/02/18.  PT dropped off a health form that needs to be completed by the end of this month. Pt would like to pick up form when ready, she has given her work phone number on form.  Pt called and would like to know the status of this form.  She has to turn it in by Friday   Best number [phone]

## 2018-05-06 NOTE — Telephone Encounter (Signed)
Pt called in to check the status of her health form. Pt says that today is her deadline to have form completed. Pt would like a call back today for pick up.

## 2018-05-06 NOTE — Telephone Encounter (Signed)
Please advise 

## 2018-05-10 NOTE — Telephone Encounter (Signed)
Have not received paperwork from provider as of yet.

## 2018-05-11 ENCOUNTER — Encounter: Payer: Self-pay | Admitting: *Deleted

## 2018-06-16 ENCOUNTER — Ambulatory Visit: Payer: Self-pay | Admitting: Emergency Medicine

## 2018-06-16 VITALS — BP 149/70 | HR 83 | Temp 97.5°F | Resp 17

## 2018-06-16 DIAGNOSIS — N390 Urinary tract infection, site not specified: Secondary | ICD-10-CM

## 2018-06-16 DIAGNOSIS — R109 Unspecified abdominal pain: Secondary | ICD-10-CM

## 2018-06-16 DIAGNOSIS — R319 Hematuria, unspecified: Secondary | ICD-10-CM

## 2018-06-16 LAB — POCT URINALYSIS DIPSTICK
Bilirubin, UA: NEGATIVE
Glucose, UA: NEGATIVE
Ketones, UA: NEGATIVE
Nitrite, UA: NEGATIVE
Protein, UA: NEGATIVE
Spec Grav, UA: 1.015 (ref 1.010–1.025)
Urobilinogen, UA: 0.2 E.U./dL
pH, UA: 7 (ref 5.0–8.0)

## 2018-06-16 MED ORDER — CIPROFLOXACIN HCL 500 MG PO TABS: 500.0000 mg | ORAL_TABLET | Freq: Two times a day (BID) | ORAL | 0 refills | Status: DC

## 2018-06-16 NOTE — Progress Notes (Signed)
  Subjective:     Patient ID: Sandra Richardson, female   DOB: 28-Sep-1951, 67 y.o.   MRN: [id number]  HPI Yesterday, developed moderate right flank pain, that increased to severe 8 out of 10 intensity sharp and dull right flank pain, but has almost completely resolved this morning, intensity less than 1 out of 10. Yesterday, the right flank pain radiated to the right mid abdomen and suprapubic area. She denies dysuria or hematuria or urinary frequency. She recalls that she had a UTI in the past, and Cipro treated effectively without any side effects.  Review of Systems No fever or chills or nausea or vomiting. Recalls no injury. No chest pain or shortness of breath. No pelvic/vaginal symptoms.    Objective:   Physical Exam  Abdominal: Bowel sounds are normal. There is no hepatosplenomegaly. There is no rigidity, no rebound, no guarding, no CVA tenderness, no tenderness at McBurney's point and negative Murphy's sign.  Abdomen: obese. Minimal right mid abdominal tenderness. No guarding or rebound. - In general:Pleasant female, no distress.  Oral mucous membranes moist Heart: Regular rate and rhythm Lungs: Clear. No definite chest wall or rib or intercostal tenderness. Back: No spinal tenderness or deformity. No definite CVA tenderness. There is mild right flank tenderness to palpation. Upon torsion at the waist, this does not exacerbate the right flank pain.        Assessment:     Right flank pain. It's possible she's had a kidney stone, but she is not in pain at this time. Imaging not indicated at this time, in my opinion. Advised to drink plenty of fluids and strain urine. Urinalysis positive for blood and white blood cells, we'll send off urine culture. She likely has UTI. Will start Cipro 500 mg twice a day after risks benefits alternatives discussed.      Plan:     Treatment options discussed, as well as risks, benefits, alternatives. Patient voiced understanding and agreement with  the following plans:  "Based on your history, physical exam, and urine test today, most likely have urinary tract infection. It's possible that you have a kidney stone, but your pain level is so much improved from last night that I don't feel that imaging/CT scan is indicated at this time. Will send urine for urine culture to try to identify any germ causing urinary infection. Will treat with Cipro, as you've done well with Cipro for urinary tract infection in the past. Drink plenty of fluids. May take Tylenol if needed for pain ( avoid Motrin and other NSAIDs with your history of allergy to diclofenac) Please read attached instruction sheets. If your symptoms come back, return to this clinic or your PCP tomorrow to reevaluate. If any severe symptoms, go to emergency room."  Follow-up with your primary care doctor in 1-2 days if not improving. ER if any severe symptoms. Precautions discussed. Red flags discussed. An After Visit Summary was printed and given to the patient. Questions invited and answered. Patient voiced understanding and agreement.

## 2018-06-16 NOTE — Patient Instructions (Addendum)
Based on your history, physical exam, and urine test today, most likely have urinary tract infection. It's possible that you have a kidney stone, but your pain level is so much improved from last night that I don't feel that imaging/CT scan is indicated at this time. Will send urine for urine culture to try to identify any germ causing urinary infection. Will treat with Cipro, as you've done well with Cipro for urinary tract infection in the past. Drink plenty of fluids. May take Tylenol if needed for pain ( avoid Motrin and other NSAIDs with your history of allergy to diclofenac) Please read attached instruction sheets. If your symptoms come back, return to this clinic or your PCP tomorrow to reevaluate. If any severe symptoms, go to emergency room. Urinary Tract Infection, Adult A urinary tract infection (UTI) is an infection of any part of the urinary tract, which includes the kidneys, ureters, bladder, and urethra. These organs make, store, and get rid of urine in the body. UTI can be a bladder infection (cystitis) or kidney infection (pyelonephritis). What are the causes? This infection may be caused by fungi, viruses, or bacteria. Bacteria are the most common cause of UTIs. This condition can also be caused by repeated incomplete emptying of the bladder during urination. What increases the risk? This condition is more likely to develop if:  You ignore your need to urinate or hold urine for long periods of time.  You do not empty your bladder completely during urination.  You wipe back to front after urinating or having a bowel movement, if you are female.  You are uncircumcised, if you are female.  You are constipated.  You have a urinary catheter that stays in place (indwelling).  You have a weak defense (immune) system.  You have a medical condition that affects your bowels, kidneys, or bladder.  You have diabetes.  You take antibiotic medicines frequently or for long periods of  time, and the antibiotics no longer work well against certain types of infections (antibiotic resistance).  You take medicines that irritate your urinary tract.  You are exposed to chemicals that irritate your urinary tract.  You are female.  What are the signs or symptoms? Symptoms of this condition include:  Fever.  Frequent urination or passing small amounts of urine frequently.  Needing to urinate urgently.  Pain or burning with urination.  Urine that smells bad or unusual.  Cloudy urine.  Pain in the lower abdomen or back.  Trouble urinating.  Blood in the urine.  Vomiting or being less hungry than normal.  Diarrhea or abdominal pain.  Vaginal discharge, if you are female.  How is this diagnosed? This condition is diagnosed with a medical history and physical exam. You will also need to provide a urine sample to test your urine. Other tests may be done, including:  Blood tests.  Sexually transmitted disease (STD) testing.  If you have had more than one UTI, a cystoscopy or imaging studies may be done to determine the cause of the infections. How is this treated? Treatment for this condition often includes a combination of two or more of the following:  Antibiotic medicine.  Other medicines to treat less common causes of UTI.  Over-the-counter medicines to treat pain.  Drinking enough water to stay hydrated.  Follow these instructions at home:  Take over-the-counter and prescription medicines only as told by your health care provider.  If you were prescribed an antibiotic, take it as told by your health care  provider. Do not stop taking the antibiotic even if you start to feel better.  Avoid alcohol, caffeine, tea, and carbonated beverages. They can irritate your bladder.  Drink enough fluid to keep your urine clear or pale yellow.  Keep all follow-up visits as told by your health care provider. This is important.  Make sure to: ? Empty your  bladder often and completely. Do not hold urine for long periods of time. ? Empty your bladder before and after sex. ? Wipe from front to back after a bowel movement if you are female. Use each tissue one time when you wipe. Contact a health care provider if:  You have back pain.  You have a fever.  You feel nauseous or vomit.  Your symptoms do not get better after 3 days.  Your symptoms go away and then return. Get help right away if:  You have severe back pain or lower abdominal pain.  You are vomiting and cannot keep down any medicines or water. This information is not intended to replace advice given to you by your health care provider. Make sure you discuss any questions you have with your health care provider. Document Released: 08/05/2005 Document Revised: 04/08/2016 Document Reviewed: 09/16/2015 Elsevier Interactive Patient Education  [employer].

## 2018-06-17 ENCOUNTER — Ambulatory Visit: Payer: Self-pay

## 2018-06-18 LAB — URINE CULTURE

## 2018-07-22 ENCOUNTER — Encounter: Payer: Self-pay | Admitting: Internal Medicine

## 2018-07-22 ENCOUNTER — Ambulatory Visit (INDEPENDENT_AMBULATORY_CARE_PROVIDER_SITE_OTHER): Payer: Managed Care, Other (non HMO) | Admitting: Internal Medicine

## 2018-07-22 VITALS — BP 120/64 | HR 78 | Temp 97.8°F | Ht 64.0 in | Wt 272.2 lb

## 2018-07-22 DIAGNOSIS — Z0000 Encounter for general adult medical examination without abnormal findings: Secondary | ICD-10-CM | POA: Diagnosis not present

## 2018-07-22 DIAGNOSIS — Z23 Encounter for immunization: Secondary | ICD-10-CM | POA: Diagnosis not present

## 2018-07-22 DIAGNOSIS — E785 Hyperlipidemia, unspecified: Secondary | ICD-10-CM | POA: Diagnosis not present

## 2018-07-22 DIAGNOSIS — I10 Essential (primary) hypertension: Secondary | ICD-10-CM

## 2018-07-22 DIAGNOSIS — Z1231 Encounter for screening mammogram for malignant neoplasm of breast: Secondary | ICD-10-CM | POA: Diagnosis not present

## 2018-07-22 DIAGNOSIS — E559 Vitamin D deficiency, unspecified: Secondary | ICD-10-CM | POA: Insufficient documentation

## 2018-07-22 DIAGNOSIS — Z124 Encounter for screening for malignant neoplasm of cervix: Secondary | ICD-10-CM

## 2018-07-22 NOTE — Progress Notes (Signed)
Chief Complaint  Patient presents with  . Follow-up   Annual  1. HTN controlled  2. HLD on lipitor 20 mg qhs  3. Vit D def on D3 5000 IU qd    Review of Systems  Constitutional: Positive for weight loss.       5 lbs  HENT: Negative for hearing loss.   Eyes: Negative for blurred vision.  Respiratory: Negative for shortness of breath.   Cardiovascular: Negative for chest pain.  Gastrointestinal: Negative for abdominal pain.  Genitourinary:       No vaginal blood  Skin: Negative for rash.  Neurological: Negative for headaches.  Psychiatric/Behavioral: Negative for depression.   Past Medical History:  Diagnosis Date  . Allergy   . Chicken pox   . Hyperlipidemia   . Hypertension   . Liver cyst   . Ovarian cyst    1979  . UTI (urinary tract infection)    Past Surgical History:  Procedure Laterality Date  . ANKLE SURGERY     1987  . GANGLION CYST EXCISION     70s/80s  . TOE SURGERY     R great toe late 1990s  . TONSILLECTOMY AND ADENOIDECTOMY     1972   Family History  Problem Relation Age of Onset  . Heart disease Mother   . Hypertension Mother   . Hyperthyroidism Mother   . Osteoporosis Mother   . Heart disease Father   . COPD Father   . Arthritis Sister   . Depression Sister   . Hypertension Sister   . Hypothyroidism Sister   . Hyperthyroidism Brother        s/p ablation   . Stroke Maternal Grandmother   . Pneumonia Maternal Grandfather   . Cancer Paternal Grandmother   . Heart disease Paternal Grandfather   . Cancer Brother    Social History   Socioeconomic History  . Marital status: Single    Spouse name: Not on file  . Number of children: Not on file  . Years of education: Not on file  . Highest education level: Not on file  Occupational History  . Not on file  Social Needs  . Financial resource strain: Not on file  . Food insecurity:    Worry: Not on file    Inability: Not on file  . Transportation needs:    Medical: Not on file   Non-medical: Not on file  Tobacco Use  . Smoking status: Never Smoker  . Smokeless tobacco: Never Used  Substance and Sexual Activity  . Alcohol use: Not Currently    Alcohol/week: 0.0 standard drinks  . Drug use: Not Currently  . Sexual activity: Not Currently  Lifestyle  . Physical activity:    Days per week: Not on file    Minutes per session: Not on file  . Stress: Not on file  Relationships  . Social connections:    Talks on phone: Not on file    Gets together: Not on file    Attends religious service: Not on file    Active member of club or organization: Not on file    Attends meetings of clubs or organizations: Not on file    Relationship status: Not on file  . Intimate partner violence:    Fear of current or ex partner: Not on file    Emotionally abused: Not on file    Physically abused: Not on file    Forced sexual activity: Not on file  Other Topics Concern  .  Not on file  Social History Narrative   No kids    Owns guns    Wears seat belts    Safe in relationship    [employer] ed    [employer] job    No outpatient medications have been marked as taking for the 07/22/18 encounter (Office Visit) with McLean-Scocuzza, Nino Glow, MD.   Allergies  Allergen Reactions  . Diclofenac Sodium Rash   Recent Results (from the past 2160 hour(s))  POCT Urinalysis Dipstick (CPT 81002)     Status: Abnormal   Collection Time: 06/16/18 10:10 AM  Result Value Ref Range   Color, UA Yellow    Clarity, UA Clear    Glucose, UA Negative Negative   Bilirubin, UA Negative    Ketones, UA Negative    Spec Grav, UA 1.015 1.010 - 1.025   Blood, UA Small (1+)    pH, UA 7.0 5.0 - 8.0   Protein, UA Negative Negative   Urobilinogen, UA 0.2 0.2 or 1.0 E.U./dL   Nitrite, UA Negative    Leukocytes, UA Small (1+) (A) Negative   Appearance     Odor    Urine Culture     Status: None   Collection Time: 06/16/18 10:10 AM  Result Value Ref Range   Urine Culture, Routine Final report     Organism ID, Bacteria Comment     Comment: Culture shows less than 10,000 colony forming units of bacteria per milliliter of urine. This colony count is not generally considered to be clinically significant.    Objective  Body mass index is 46.72 kg/m. Wt Readings from Last 3 Encounters:  07/22/18 272 lb 3.2 oz (123.5 kg)  03/10/18 277 lb 9.6 oz (125.9 kg)   Temp Readings from Last 3 Encounters:  07/22/18 97.8 F (36.6 C) (Oral)  06/16/18 (!) 97.5 F (36.4 C) (Oral)  03/10/18 98.2 F (36.8 C) (Oral)   BP Readings from Last 3 Encounters:  07/22/18 120/64  06/16/18 (!) 149/70  03/10/18 134/74   Pulse Readings from Last 3 Encounters:  07/22/18 78  06/16/18 83  03/10/18 75    Physical Exam  Constitutional: She is oriented to person, place, and time. Vital signs are normal. She appears well-developed and well-nourished. She is cooperative.  HENT:  Head: Normocephalic and atraumatic.  Mouth/Throat: Oropharynx is clear and moist and mucous membranes are normal.  Eyes: Pupils are equal, round, and reactive to light. Conjunctivae are normal.  Cardiovascular: Normal rate, regular rhythm and normal heart sounds.  Pulmonary/Chest: Effort normal and breath sounds normal.  Genitourinary: Vagina normal.    Pelvic exam was performed with patient supine. There is no rash on the right labia. There is lesion on the left labia. There is no rash on the left labia.  Genitourinary Comments: Difficult exam unable to advance speculum   Neurological: She is alert and oriented to person, place, and time. Gait normal.  Skin: Skin is warm, dry and intact.  Psychiatric: She has a normal mood and affect. Her speech is normal and behavior is normal. Judgment and thought content normal. Cognition and memory are normal.  Nursing note and vitals reviewed.   Assessment   1. HTN/HLD 2. Vit D def  3. HM/annual Plan  1. Cont meds  Recheck BMET, lipid, UA, MMR  2. D3 5000 IU qd  3. Will get flu at  work  prevnar given today  Tdap 03/10/18  disc'ed shingrix today  Declines hiv, hep b/c  Check MMR  colonsocopy 06/11/14 IH/[facility] fair prep and diverticulosis [facility] rec repeat in 5 years  DEXA 04/29/18 osteopenia on ca 600 mg qd and vit D 5000 IU qd   Pap unable to do today refer Dr. Smitty Knudsen OB/[facility] Mammogram referred today   Congratulated wt loss rec exerise and healthy diet choices  Never smoker   Eye Dr. Ellin Mayhew Dentist Dr. Leanor Kail   Provider: Dr. Olivia Mackie McLean-Scocuzza-Internal Medicine

## 2018-07-22 NOTE — Patient Instructions (Addendum)
Shingrix vaccine for shingles   Recombinant Zoster (Shingles) Vaccine, RZV: What You Need to Know 1. Why get vaccinated? Shingles (also called herpes zoster, or just zoster) is a painful skin rash, often with blisters. Shingles is caused by the varicella zoster virus, the same virus that causes chickenpox. After you have chickenpox, the virus stays in your body and can cause shingles later in life. You can't catch shingles from another person. However, a person who has never had chickenpox (or chickenpox vaccine) could get chickenpox from someone with shingles. A shingles rash usually appears on one side of the face or body and heals within 2 to 4 weeks. Its main symptom is pain, which can be severe. Other symptoms can include fever, headache, chills and upset stomach. Very rarely, a shingles infection can lead to pneumonia, hearing problems, blindness, brain inflammation (encephalitis), or death. For about 1 person in 5, severe pain can continue even long after the rash has cleared up. This long-lasting pain is called post-herpetic neuralgia (PHN). Shingles is far more common in people 72 years of age and older than in younger people, and the risk increases with age. It is also more common in people whose immune system is weakened because of a disease such as cancer, or by drugs such as steroids or chemotherapy. At least 1 million people a year in the [place] get shingles. 2. Shingles vaccine (recombinant) Recombinant shingles vaccine was approved by FDA in 2017 for the prevention of shingles. In clinical trials, it was more than 90% effective in preventing shingles. It can also reduce the likelihood of PHN. Two doses, 2 to 6 months apart, are recommended for adults 63 and older. This vaccine is also recommended for people who have already gotten the live shingles vaccine (Zostavax). There is no live virus in this vaccine. 3. Some people should not get this vaccine Tell your vaccine provider if  you:  Have any severe, life-threatening allergies. A person who has ever had a life-threatening allergic reaction after a dose of recombinant shingles vaccine, or has a severe allergy to any component of this vaccine, may be advised not to be vaccinated. Ask your health care provider if you want information about vaccine components.  Are pregnant or breastfeeding. There is not much information about use of recombinant shingles vaccine in pregnant or nursing women. Your healthcare provider might recommend delaying vaccination.  Are not feeling well. If you have a mild illness, such as a cold, you can probably get the vaccine today. If you are moderately or severely ill, you should probably wait until you recover. Your doctor can advise you.  4. Risks of a vaccine reaction With any medicine, including vaccines, there is a chance of reactions. After recombinant shingles vaccination, a person might experience:  Pain, redness, soreness, or swelling at the site of the injection  Headache, muscle aches, fever, shivering, fatigue  In clinical trials, most people got a sore arm with mild or moderate pain after vaccination, and some also had redness and swelling where they got the shot. Some people felt tired, had muscle pain, a headache, shivering, fever, stomach pain, or nausea. About 1 out of 6 people who got recombinant zoster vaccine experienced side effects that prevented them from doing regular activities. Symptoms went away on their own in about 2 to 3 days. Side effects were more common in younger people. You should still get the second dose of recombinant zoster vaccine even if you had one of these reactions after  the first dose. Other things that could happen after this vaccine:  People sometimes faint after medical procedures, including vaccination. Sitting or lying down for about 15 minutes can help prevent fainting and injuries caused by a fall. Tell your provider if you feel dizzy or have  vision changes or ringing in the ears.  Some people get shoulder pain that can be more severe and longer-lasting than routine soreness that can follow injections. This happens very rarely.  Any medication can cause a severe allergic reaction. Such reactions to a vaccine are estimated at about 1 in a million doses, and would happen within a few minutes to a few hours after the vaccination. As with any medicine, there is a very remote chance of a vaccine causing a serious injury or death. The safety of vaccines is always being monitored. For more information, visit: http://www.aguilar.org/ 5. What if there is a serious problem? What should I look for?  Look for anything that concerns you, such as signs of a severe allergic reaction, very high fever, or unusual behavior. Signs of a severe allergic reaction can include hives, swelling of the face and throat, difficulty breathing, a fast heartbeat, dizziness, and weakness. These would usually start a few minutes to a few hours after the vaccination. What should I do?  If you think it is a severe allergic reaction or other emergency that can't wait, call 9-1-1 and get to the nearest hospital. Otherwise, call your health care provider. Afterward, the reaction should be reported to the Vaccine Adverse Event Reporting System (VAERS). Your doctor should file this report, or you can do it yourself through the VAERS web site atwww.vaers.https://www.bray.com/ by calling 2045311138. VAERS does not give medical advice. 6. How can I learn more?  Ask your healthcare provider. He or she can give you the vaccine package insert or suggest other sources of information.  Call your local or state health department.  Contact the Centers for Disease Control and Prevention (CDC): ? Call (606)790-8808 (1-800-CDC-INFO) or ? Visit the CDC's website at http://hunter.com/ CDC Vaccine Information Statement (VIS) Recombinant Zoster Vaccine (12/21/2016) This information is  not intended to replace advice given to you by your health care provider. Make sure you discuss any questions you have with your health care provider. Document Released: 01/05/2017 Document Revised: 01/05/2017 Document Reviewed: 01/05/2017 Elsevier Interactive Patient Education  2018 [place].  Seborrheic Keratosis Seborrheic keratosis is a common, noncancerous (benign) skin growth. This condition causes waxy, rough, tan, brown, or black spots to appear on the skin. These skin growths can be flat or raised. What are the causes? The cause of this condition is not known. What increases the risk? This condition is more likely to develop in:  People who have a family history of seborrheic keratosis.  People who are 72 or older.  People who are pregnant.  People who have had estrogen replacement therapy.  What are the signs or symptoms? This condition often occurs on the face, chest, shoulders, back, or other areas. These growths:  Are usually painless, but may become irritated and itchy.  Can be yellow, brown, black, or other colors.  Are slightly raised or have a flat surface.  Are sometimes rough or wart-like in texture.  Are often waxy on the surface.  Are round or oval-shaped.  Sometimes look like they are "stuck on."  Often occur in groups, but may occur as a single growth.  How is this diagnosed? This condition is diagnosed with a medical history and  physical exam. A sample of the growth may be tested (skin biopsy). You may need to see a skin specialist (dermatologist). How is this treated? Treatment is not usually needed for this condition, unless the growths are irritated or are often bleeding. You may also choose to have the growths removed if you do not like their appearance. Most commonly, these growths are treated with a procedure in which liquid nitrogen is applied to "freeze" off the growth (cryosurgery). They may also be burned off with electricity or cut  off. Follow these instructions at home:  Watch your growth for any changes.  Keep all follow-up visits as told by your health care provider. This is important.  Do not scratch or pick at the growth or growths. This can cause them to become irritated or infected. Contact a health care provider if:  You suddenly have many new growths.  Your growth bleeds, itches, or hurts.  Your growth suddenly becomes larger or changes color. This information is not intended to replace advice given to you by your health care provider. Make sure you discuss any questions you have with your health care provider. Document Released: 11/28/2010 Document Revised: 04/02/2016 Document Reviewed: 03/13/2015 Elsevier Interactive Patient Education  2018 [employer].   Cholesterol Cholesterol is a white, waxy, fat-like substance that is needed by the human body in small amounts. The liver makes all the cholesterol we need. Cholesterol is carried from the liver by the blood through the blood vessels. Deposits of cholesterol (plaques) may build up on blood vessel (artery) walls. Plaques make the arteries narrower and stiffer. Cholesterol plaques increase the risk for heart attack and stroke. You cannot feel your cholesterol level even if it is very high. The only way to know that it is high is to have a blood test. Once you know your cholesterol levels, you should keep a record of the test results. Work with your health care provider to keep your levels in the desired range. What do the results mean?  Total cholesterol is a rough measure of all the cholesterol in your blood.  LDL (low-density lipoprotein) is the "bad" cholesterol. This is the type that causes plaque to build up on the artery walls. You want this level to be low.  HDL (high-density lipoprotein) is the "good" cholesterol because it cleans the arteries and carries the LDL away. You want this level to be high.  Triglycerides are fat that the body can  either burn for energy or store. High levels are closely linked to heart disease. What are the desired levels of cholesterol?  Total cholesterol below 200.  LDL below 100 for people who are at risk, below 70 for people at very high risk.  HDL above 40 is good. A level of 60 or higher is considered to be protective against heart disease.  Triglycerides below 150. How can I lower my cholesterol? Diet Follow your diet program as told by your health care provider.  Choose fish or white meat chicken and [place], roasted or baked. Limit fatty cuts of red meat, fried foods, and processed meats, such as sausage and lunch meats.  Eat lots of fresh fruits and vegetables.  Choose whole grains, beans, pasta, potatoes, and cereals.  Choose olive oil, corn oil, or canola oil, and use only small amounts.  Avoid butter, mayonnaise, shortening, or palm kernel oils.  Avoid foods with trans fats.  Drink skim or nonfat milk and eat low-fat or nonfat yogurt and cheeses. Avoid whole milk, cream,  ice cream, egg yolks, and full-fat cheeses.  Healthier desserts include angel food cake, ginger snaps, animal crackers, hard candy, popsicles, and low-fat or nonfat frozen yogurt. Avoid pastries, cakes, pies, and cookies.  Exercise  Follow your exercise program as told by your health care provider. A regular program: ? Helps to decrease LDL and raise HDL. ? Helps with weight control.  Do things that increase your activity level, such as gardening, walking, and taking the stairs.  Ask your health care provider about ways that you can be more active in your daily life.  Medicine  Take over-the-counter and prescription medicines only as told by your health care provider. ? Medicine may be prescribed by your health care provider to help lower cholesterol and decrease the risk for heart disease. This is usually done if diet and exercise have failed to bring down cholesterol levels. ? If you have several risk  factors, you may need medicine even if your levels are normal.  This information is not intended to replace advice given to you by your health care provider. Make sure you discuss any questions you have with your health care provider. Document Released: 07/21/2001 Document Revised: 05/23/2016 Document Reviewed: 11-17-2015 Elsevier Interactive Patient Education  [employer].

## 2018-07-29 ENCOUNTER — Ambulatory Visit: Payer: Self-pay

## 2018-07-29 NOTE — Telephone Encounter (Signed)
Received pneumonia vaccine last Friday. Reports has still has pain at the injection site, left upper arm. No redness or swelling noted. Instructed to apply ice pack for pain relief. Will also take Tylenol for pain. If she develops redness, swelling will go to UC. Instructed to call Monday if she is still having pain to arm to have an OV for evaluation. Verbalizes understanding. Reason for Disposition . Pneumococcal vaccine reactions  Answer Assessment - Initial Assessment Questions 1. SYMPTOMS: "What is the main symptom?" (e.g., redness, swelling, pain)      Still has pain left upper arm 2. ONSET: "When was the vaccine (shot) given?" "How much later did the 1 WEEK AGO__ begin?" (e.g., hours, days ago)      1 week ago 3. SEVERITY: "How bad is it?"      6 4. FEVER: "Is there a fever?" If so, ask: "What is it, how was it measured, and when did it start?"      No 5. IMMUNIZATIONS GIVEN: "What shots have you recently received?"      Pnuemonia 6. PAST REACTIONS: "Have you reacted to immunizations before?" If so, ask: "What happened?"     No 7. OTHER SYMPTOMS: "Do you have any other symptoms?"     No  Protocols used: IMMUNIZATION REACTIONS-A-AH

## 2018-07-29 NOTE — Telephone Encounter (Signed)
Patient has been triaged by [facility] nurse and given recommendations.

## 2018-08-11 ENCOUNTER — Ambulatory Visit
Admission: RE | Admit: 2018-08-11 | Discharge: 2018-08-11 | Disposition: A | Discharge status: Home / Self Care | Payer: Managed Care, Other (non HMO) | Source: Ambulatory Visit | Attending: Internal Medicine | Admitting: Internal Medicine

## 2018-08-11 DIAGNOSIS — Z1231 Encounter for screening mammogram for malignant neoplasm of breast: Secondary | ICD-10-CM | POA: Diagnosis present

## 2018-09-28 ENCOUNTER — Other Ambulatory Visit: Payer: Self-pay

## 2018-09-28 DIAGNOSIS — R319 Hematuria, unspecified: Secondary | ICD-10-CM

## 2018-09-28 NOTE — Addendum Note (Signed)
Addended by: Judie Petit on: 09/28/2018 09:54 AM   Modules accepted: Level of Service

## 2018-09-29 ENCOUNTER — Telehealth: Payer: Self-pay | Admitting: Radiology

## 2018-09-29 ENCOUNTER — Other Ambulatory Visit: Payer: Self-pay | Admitting: Internal Medicine

## 2018-09-29 ENCOUNTER — Other Ambulatory Visit (INDEPENDENT_AMBULATORY_CARE_PROVIDER_SITE_OTHER): Payer: Managed Care, Other (non HMO)

## 2018-09-29 DIAGNOSIS — E559 Vitamin D deficiency, unspecified: Secondary | ICD-10-CM

## 2018-09-29 DIAGNOSIS — Z0184 Encounter for antibody response examination: Secondary | ICD-10-CM

## 2018-09-29 DIAGNOSIS — Z1159 Encounter for screening for other viral diseases: Secondary | ICD-10-CM

## 2018-09-29 DIAGNOSIS — I10 Essential (primary) hypertension: Secondary | ICD-10-CM

## 2018-09-29 DIAGNOSIS — E785 Hyperlipidemia, unspecified: Secondary | ICD-10-CM

## 2018-09-29 LAB — URINALYSIS, ROUTINE W REFLEX MICROSCOPIC
Bilirubin, UA: NEGATIVE
Glucose, UA: NEGATIVE
KETONES UA: NEGATIVE
Leukocytes, UA: NEGATIVE
NITRITE UA: NEGATIVE
Protein, UA: NEGATIVE
RBC, UA: NEGATIVE
SPEC GRAV UA: 1.02 (ref 1.005–1.030)
Urobilinogen, Ur: 1 mg/dL (ref 0.2–1.0)
pH, UA: 7 (ref 5.0–7.5)

## 2018-09-29 MED ORDER — AMLODIPINE BESY-BENAZEPRIL HCL 10-20 MG PO CAPS: 1.0000 | ORAL_CAPSULE | Freq: Every day | ORAL | 2 refills | Status: DC

## 2018-09-29 NOTE — Addendum Note (Signed)
Addended by: Arby Barrette on: 09/29/2018 09:01 AM   Modules accepted: Orders

## 2018-09-29 NOTE — Progress Notes (Signed)
Lab results from 09/28/18 routed to the ordering Provider Dr. Leana Roe McLean-Scocuzza MD -[facility] Georgette Helmer CMA

## 2018-09-29 NOTE — Telephone Encounter (Signed)
Pt stated you sent her with paper work to [facility] at [facility] and ordered them through [facility]. Pt stated they could not get her blood drawn. Please reordered future orders that were placed to [facility] for blood to be sent out.

## 2018-09-29 NOTE — Telephone Encounter (Signed)
Orders were placed by PCP. Original orders placed by PCP for pt to get drawn at [facility] were MMR Titer, BMP, and Lipid Panel. Pt was drawn for those labs only while awaiting orders. CBC cant be sent due to no lavender tube being drawn.

## 2018-09-30 ENCOUNTER — Other Ambulatory Visit: Payer: Self-pay

## 2018-10-01 LAB — LIPID PANEL
CHOLESTEROL TOTAL: 173 mg/dL (ref 100–199)
Chol/HDL Ratio: 2.6 ratio (ref 0.0–4.4)
HDL: 66 mg/dL (ref >39)
LDL CALC: 94 mg/dL (ref 0–99)
TRIGLYCERIDES: 67 mg/dL (ref 0–149)
VLDL CHOLESTEROL CAL: 13 mg/dL (ref 5–40)

## 2018-10-01 LAB — COMPREHENSIVE METABOLIC PANEL
ALK PHOS: 68 IU/L (ref 39–117)
ALT: 14 IU/L (ref 0–32)
AST: 18 IU/L (ref 0–40)
Albumin/Globulin Ratio: 1.6 (ref 1.2–2.2)
Albumin: 4.5 g/dL (ref 3.6–4.8)
BUN/Creatinine Ratio: 19 (ref 12–28)
BUN: 20 mg/dL (ref 8–27)
Bilirubin Total: 0.5 mg/dL (ref 0.0–1.2)
CO2: 20 mmol/L (ref 20–29)
CREATININE: 1.04 mg/dL — AB (ref 0.57–1.00)
Calcium: 10 mg/dL (ref 8.7–10.3)
Chloride: 99 mmol/L (ref 96–106)
GFR calc Af Amer: 65 mL/min/{1.73_m2} (ref >59)
GFR calc non Af Amer: 56 mL/min/{1.73_m2} — ABNORMAL LOW (ref >59)
GLUCOSE: 97 mg/dL (ref 65–99)
Globulin, Total: 2.8 g/dL (ref 1.5–4.5)
Potassium: 4.8 mmol/L (ref 3.5–5.2)
Sodium: 138 mmol/L (ref 134–144)
Total Protein: 7.3 g/dL (ref 6.0–8.5)

## 2018-10-01 LAB — MEASLES/MUMPS/RUBELLA IMMUNITY
MUMPS ABS, IGG: >300 AU/mL (ref >10.9)
RUBELLA: 26.7 {index} (ref >0.99)

## 2018-10-01 LAB — VITAMIN D 25 HYDROXY (VIT D DEFICIENCY, FRACTURES): Vit D, 25-Hydroxy: 47.7 ng/mL (ref 30.0–100.0)

## 2018-12-22 ENCOUNTER — Ambulatory Visit (INDEPENDENT_AMBULATORY_CARE_PROVIDER_SITE_OTHER): Payer: Managed Care, Other (non HMO) | Admitting: Internal Medicine

## 2018-12-22 ENCOUNTER — Other Ambulatory Visit: Payer: Self-pay

## 2018-12-22 ENCOUNTER — Encounter: Payer: Self-pay | Admitting: Internal Medicine

## 2018-12-22 VITALS — BP 132/64 | HR 72 | Temp 98.1°F | Ht 64.0 in | Wt 269.8 lb

## 2018-12-22 DIAGNOSIS — N3281 Overactive bladder: Secondary | ICD-10-CM | POA: Diagnosis not present

## 2018-12-22 DIAGNOSIS — I10 Essential (primary) hypertension: Secondary | ICD-10-CM

## 2018-12-22 DIAGNOSIS — M25471 Effusion, right ankle: Secondary | ICD-10-CM | POA: Diagnosis not present

## 2018-12-22 DIAGNOSIS — R319 Hematuria, unspecified: Secondary | ICD-10-CM

## 2018-12-22 NOTE — Progress Notes (Signed)
Chief Complaint  Patient presents with  . Follow-up   F/u  1. HTN controlled repeat 132/64 repeat 138/58 initial  2. Hematuria noted sat. Prior to visit w/o burning but she does have increased urinary freq qhs, right sided flank pain reviewed cT 2008 no kidney stones dad has h/o kidney stones.    Review of Systems  Constitutional: Negative for weight loss.  HENT: Negative for hearing loss.   Eyes: Negative for blurred vision.  Respiratory: Negative for shortness of breath.   Cardiovascular: Negative for chest pain.  Gastrointestinal: Negative for abdominal pain.  Genitourinary: Positive for hematuria.  Musculoskeletal: Negative for falls.  Skin: Negative for rash.  Neurological: Negative for headaches.  Psychiatric/Behavioral: Negative for depression.   Past Medical History:  Diagnosis Date  . Allergy   . Chicken pox   . Hyperlipidemia   . Hypertension   . Liver cyst   . Ovarian cyst    1979  . UTI (urinary tract infection)    Past Surgical History:  Procedure Laterality Date  . ANKLE SURGERY     1987  . GANGLION CYST EXCISION     70s/80s  . TOE SURGERY     R great toe late 1990s  . TONSILLECTOMY AND ADENOIDECTOMY     1972   Family History  Problem Relation Age of Onset  . Heart disease Mother   . Hypertension Mother   . Hyperthyroidism Mother   . Osteoporosis Mother   . Heart disease Father   . COPD Father   . Arthritis Sister   . Depression Sister   . Hypertension Sister   . Hypothyroidism Sister   . Hyperthyroidism Brother        s/p ablation   . Stroke Maternal Grandmother   . Pneumonia Maternal Grandfather   . Cancer Paternal Grandmother   . Heart disease Paternal Grandfather   . Cancer Brother   . Breast cancer Neg Hx    Social History   Socioeconomic History  . Marital status: Single    Spouse name: Not on file  . Number of children: Not on file  . Years of education: Not on file  . Highest education level: Not on file  Occupational  History  . Not on file  Social Needs  . Financial resource strain: Not on file  . Food insecurity:    Worry: Not on file    Inability: Not on file  . Transportation needs:    Medical: Not on file    Non-medical: Not on file  Tobacco Use  . Smoking status: Never Smoker  . Smokeless tobacco: Never Used  Substance and Sexual Activity  . Alcohol use: Not Currently    Alcohol/week: 0.0 standard drinks  . Drug use: Not Currently  . Sexual activity: Not Currently  Lifestyle  . Physical activity:    Days per week: Not on file    Minutes per session: Not on file  . Stress: Not on file  Relationships  . Social connections:    Talks on phone: Not on file    Gets together: Not on file    Attends religious service: Not on file    Active member of club or organization: Not on file    Attends meetings of clubs or organizations: Not on file    Relationship status: Not on file  . Intimate partner violence:    Fear of current or ex partner: Not on file    Emotionally abused: Not on file  Physically abused: Not on file    Forced sexual activity: Not on file  Other Topics Concern  . Not on file  Social History Narrative   No kids    Owns guns    Wears seat belts    Safe in relationship    [employer] ed    [employer] job    Current Meds  Medication Sig  . amLODipine-benazepril (LOTREL) 10-20 MG capsule Take 1 capsule by mouth daily.  Marland Kitchen atorvastatin (LIPITOR) 20 MG tablet Take 1 tablet (20 mg total) by mouth daily at 6 PM.  . Calcium Carb-Cholecalciferol (CALCIUM CARBONATE-VITAMIN D3 PO) Take 1,000 Units by mouth.   . Cholecalciferol (VITAMIN D-3) 5000 units TABS Take by mouth.   Allergies  Allergen Reactions  . Diclofenac Sodium Rash   Recent Results (from the past 2160 hour(s))  Urinalysis, Routine w reflex microscopic     Status: None   Collection Time: 09/28/18  9:37 AM  Result Value Ref Range   Specific Gravity, UA 1.020 1.005 - 1.030   pH, UA 7.0 5.0 - 7.5   Color, UA  Yellow Yellow   Appearance Ur Clear Clear   Leukocytes, UA Negative Negative   Protein, UA Negative Negative/Trace   Glucose, UA Negative Negative   Ketones, UA Negative Negative   RBC, UA Negative Negative   Bilirubin, UA Negative Negative   Urobilinogen, Ur 1.0 0.2 - 1.0 mg/dL   Nitrite, UA Negative Negative   Microscopic Examination Comment     Comment: Microscopic not indicated and not performed.  Measles/Mumps/Rubella Immunity     Status: None   Collection Time: 09/29/18  9:01 AM  Result Value Ref Range   Rubella Antibodies, IGG 26.70 Immune >0.99 index    Comment:                                 Non-immune       <0.90                                 Equivocal  0.90 - 0.99                                 Immune           >0.99    RUBEOLA AB, IGG >300.0 Immune >16.4 AU/mL    Comment:                                  Negative        <13.5                                  Equivocal 13.5 - 16.4                                  Positive        >16.4 Presence of antibodies to Rubeola is presumptive evidence of immunity except when acute infection is suspected.    MUMPS ABS, IGG >300.0 Immune >10.9 AU/mL    Comment:  Negative         <9.0                                 Equivocal  9.0 - 10.9                                 Positive        >10.9 A positive result generally indicates past exposure to Mumps virus or previous vaccination.   Comprehensive metabolic panel     Status: Abnormal   Collection Time: 09/29/18  9:01 AM  Result Value Ref Range   Glucose 97 65 - 99 mg/dL   BUN 20 8 - 27 mg/dL   Creatinine, Ser 1.04 (H) 0.57 - 1.00 mg/dL   GFR calc non Af Amer 56 (L) >59 mL/min/1.73   GFR calc Af Amer 65 >59 mL/min/1.73   BUN/Creatinine Ratio 19 12 - 28   Sodium 138 134 - 144 mmol/L   Potassium 4.8 3.5 - 5.2 mmol/L   Chloride 99 96 - 106 mmol/L   CO2 20 20 - 29 mmol/L   Calcium 10.0 8.7 - 10.3 mg/dL   Total Protein 7.3 6.0 - 8.5 g/dL    Albumin 4.5 3.6 - 4.8 g/dL   Globulin, Total 2.8 1.5 - 4.5 g/dL   Albumin/Globulin Ratio 1.6 1.2 - 2.2   Bilirubin Total 0.5 0.0 - 1.2 mg/dL   Alkaline Phosphatase 68 39 - 117 IU/L   AST 18 0 - 40 IU/L   ALT 14 0 - 32 IU/L  Lipid panel     Status: None   Collection Time: 09/29/18  9:01 AM  Result Value Ref Range   Cholesterol, Total 173 100 - 199 mg/dL   Triglycerides 67 0 - 149 mg/dL   HDL 66 >39 mg/dL   VLDL Cholesterol Cal 13 5 - 40 mg/dL   LDL Calculated 94 0 - 99 mg/dL   Chol/HDL Ratio 2.6 0.0 - 4.4 ratio    Comment:                                   T. Chol/HDL Ratio                                             Men  Women                               1/2 Avg.Risk  3.4    3.3                                   Avg.Risk  5.0    4.4                                2X Avg.Risk  9.6    7.1                                3X Avg.Risk  23.4   11.0   Vitamin D (25 hydroxy)     Status: None   Collection Time: 09/29/18  9:01 AM  Result Value Ref Range   Vit D, 25-Hydroxy 47.7 30.0 - 100.0 ng/mL    Comment: Vitamin D deficiency has been defined by the [place] practice guideline as a level of serum 25-OH vitamin D less than 20 ng/mL (1,2). The Endocrine Society went on to further define vitamin D insufficiency as a level between 21 and 29 ng/mL (2). 1. IOM (Institute of Medicine). 2010. Dietary reference    intakes for calcium and D. [place]: The    [employer]. 2. Holick MF, Binkley , Bischoff-Ferrari HA, et al.    Evaluation, treatment, and prevention of vitamin D    deficiency: an Endocrine Society clinical practice    guideline. JCEM. 2011 Jul; 96(7):1911-30.    Objective  Body mass index is 46.31 kg/m. Wt Readings from Last 3 Encounters:  12/22/18 269 lb 12.8 oz (122.4 kg)  07/22/18 272 lb 3.2 oz (123.5 kg)  03/10/18 277 lb 9.6 oz (125.9 kg)   Temp Readings from Last 3 Encounters:  12/22/18 98.1 F (36.7 C)   07/22/18 97.8 F (36.6 C) (Oral)  06/16/18 (!) 97.5 F (36.4 C) (Oral)   BP Readings from Last 3 Encounters:  12/22/18 (!) 138/58  07/22/18 120/64  06/16/18 (!) 149/70   Pulse Readings from Last 3 Encounters:  12/22/18 72  07/22/18 78  06/16/18 83    Physical Exam Vitals signs and nursing note reviewed.  Constitutional:      Appearance: Normal appearance. She is well-developed and well-groomed. She is obese.  HENT:     Head: Normocephalic and atraumatic.     Nose: Nose normal.     Mouth/Throat:     Mouth: Mucous membranes are moist.     Pharynx: Oropharynx is clear.  Eyes:     Conjunctiva/sclera: Conjunctivae normal.     Pupils: Pupils are equal, round, and reactive to light.  Cardiovascular:     Rate and Rhythm: Normal rate and regular rhythm.     Pulses: Normal pulses.     Heart sounds: Normal heart sounds.  Pulmonary:     Effort: Pulmonary effort is normal.     Breath sounds: Normal breath sounds.  Abdominal:     Tenderness: There is no right CVA tenderness or left CVA tenderness.  Skin:    General: Skin is warm and dry.  Neurological:     General: No focal deficit present.     Mental Status: She is alert and oriented to person, place, and time. Mental status is at baseline.     Gait: Gait normal.  Psychiatric:        Attention and Perception: Attention and perception normal.        Mood and Affect: Mood and affect normal.        Speech: Speech normal.        Behavior: Behavior normal. Behavior is cooperative.        Thought Content: Thought content normal.        Cognition and Memory: Cognition and memory normal.        Judgment: Judgment normal.     Assessment   1. Hematuria and right CVA pain noted last Saturday, she c/o overactive bladder w/o burning and urinating at lot at night  Father with h/o kidney stones she denies personal hx  2. Right ankle swelling  chronic s/p surgery  3. Overactive bladder  4. HTN controlled overall goal ideal would be  <130/<80  5. HM Plan   1. UA and culture today  Consider CT ab/pelvis w/u to w/u kidneys stones in future if +blood in urine  2. Monitor disc reducing norvasc to 5 and could increase benazepril to 40 ankle edema worsening  3. Disc call back if wants to try overactive bladder medication  4. Cont meds for now  Monitor BP  5.  Flu shot at work 10 or 09/2018  prevnar utd  Tdap 03/10/18  disc'ed shingrix today  Declines hiv, hep b/c  MMR immune   colonsocopy 06/11/14 IH/EH fair prep and diverticulosis [facility] rec repeat in 5 years  DEXA 04/29/18 osteopenia on ca 600 mg qd and vit D 5000 IU qd   Pap 09/05/18 normal  Mammogram 08/11/18 normal  Congratulated wt loss rec exerise and healthy diet choices  Never smoker   Eye Dr. Ellin Mayhew spring 2020  Dentist Dr. Leanor Kail  Provider: Dr. Olivia Mackie McLean-Scocuzza-[facility]

## 2018-12-22 NOTE — Patient Instructions (Signed)
Please increase water intake we will check urine today and if blood in urine we will order imaging CT scan for kidney stones   Dentist Dr. Johnnette Litter [phone]   Hematuria, Adult Hematuria is blood in the urine. Blood may be visible in the urine, or it may be identified with a test. This condition can be caused by infections of the bladder, urethra, kidney, or prostate. Other possible causes include:  Kidney stones.  Cancer of the urinary tract.  Too much calcium in the urine.  Conditions that are passed from parent to child (inherited conditions).  Exercise that requires a lot of energy. Infections can usually be treated with medicine, and a kidney stone usually will pass through your urine. If neither of these is the cause of your hematuria, more tests may be needed to identify the cause of your symptoms. It is very important to tell your health care provider about any blood in your urine, even if it is painless or the blood stops without treatment. Blood in the urine, when it happens and then stops and then happens again, can be a symptom of a very serious condition, including cancer. There is no pain in the initial stages of many urinary cancers. Follow these instructions at home: Medicines  Take over-the-counter and prescription medicines only as told by your health care provider.  If you were prescribed an antibiotic medicine, take it as told by your health care provider. Do not stop taking the antibiotic even if you start to feel better. Eating and drinking  Drink enough fluid to keep your urine clear or pale yellow. It is recommended that you drink 3-4 quarts (2.8-3.8 L) a day. If you have been diagnosed with an infection, it is recommended that you drink cranberry juice in addition to large amounts of water.  Avoid caffeine, tea, and carbonated beverages. These tend to irritate the bladder.  Avoid alcohol because it may irritate the prostate (men). General  instructions  If you have been diagnosed with a kidney stone, follow your health care provider's instructions about straining your urine to catch the stone.  Empty your bladder often. Avoid holding urine for long periods of time.  If you are female: ? After a bowel movement, wipe from front to back and use each piece of toilet paper only once. ? Empty your bladder before and after sex.  Pay attention to any changes in your symptoms. Tell your health care provider about any changes or any new symptoms.  It is your responsibility to get your test results. Ask your health care provider, or the department performing the test, when your results will be ready.  Keep all follow-up visits as told by your health care provider. This is important. Contact a health care provider if:  You develop back pain.  You have a fever.  You have nausea or vomiting.  Your symptoms do not improve after 3 days.  Your symptoms get worse. Get help right away if:  You develop severe vomiting and are unable take medicine without vomiting.  You develop severe pain in your back or abdomen even though you are taking medicine.  You pass a large amount of blood in your urine.  You pass blood clots in your urine.  You feel very weak or like you might faint.  You faint. Summary  Hematuria is blood in the urine. It has many possible causes.  It is very important that you tell your health care provider about any blood  in your urine, even if it is painless or the blood stops without treatment.  Take over-the-counter and prescription medicines only as told by your health care provider.  Drink enough fluid to keep your urine clear or pale yellow. This information is not intended to replace advice given to you by your health care provider. Make sure you discuss any questions you have with your health care provider. Document Released: 10/26/2005 Document Revised: 11/28/2016 Document Reviewed:  11/28/2016 Elsevier Interactive Patient Education  2019 [employer].

## 2018-12-23 LAB — URINALYSIS, ROUTINE W REFLEX MICROSCOPIC
Bacteria, UA: NONE SEEN /HPF
Bilirubin Urine: NEGATIVE
GLUCOSE, UA: NEGATIVE
HYALINE CAST: NONE SEEN /LPF
KETONES UR: NEGATIVE
Nitrite: NEGATIVE
PH: 5.5 (ref 5.0–8.0)
Protein, ur: NEGATIVE
Specific Gravity, Urine: 1.018 (ref 1.001–1.03)

## 2018-12-23 LAB — URINE CULTURE
MICRO NUMBER:: 191850
SPECIMEN QUALITY:: ADEQUATE

## 2018-12-27 ENCOUNTER — Other Ambulatory Visit: Payer: Self-pay | Admitting: Internal Medicine

## 2018-12-27 DIAGNOSIS — R319 Hematuria, unspecified: Secondary | ICD-10-CM

## 2019-01-04 ENCOUNTER — Ambulatory Visit
Admission: RE | Admit: 2019-01-04 | Discharge: 2019-01-04 | Disposition: A | Discharge status: Home / Self Care | Payer: Managed Care, Other (non HMO) | Source: Ambulatory Visit | Attending: Internal Medicine | Admitting: Internal Medicine

## 2019-01-04 DIAGNOSIS — R319 Hematuria, unspecified: Secondary | ICD-10-CM

## 2019-01-05 ENCOUNTER — Other Ambulatory Visit: Payer: Self-pay | Admitting: Internal Medicine

## 2019-01-05 DIAGNOSIS — R1900 Intra-abdominal and pelvic swelling, mass and lump, unspecified site: Secondary | ICD-10-CM

## 2019-01-05 DIAGNOSIS — R319 Hematuria, unspecified: Secondary | ICD-10-CM

## 2019-01-24 ENCOUNTER — Encounter
Admission: RE | Admit: 2019-01-24 | Discharge: 2019-01-24 | Disposition: A | Discharge status: Home / Self Care | Payer: Managed Care, Other (non HMO) | Source: Ambulatory Visit | Attending: Obstetrics and Gynecology | Admitting: Obstetrics and Gynecology

## 2019-01-24 ENCOUNTER — Other Ambulatory Visit: Payer: Self-pay

## 2019-01-24 DIAGNOSIS — I10 Essential (primary) hypertension: Secondary | ICD-10-CM | POA: Diagnosis not present

## 2019-01-24 DIAGNOSIS — Z01818 Encounter for other preprocedural examination: Secondary | ICD-10-CM | POA: Insufficient documentation

## 2019-01-24 HISTORY — DX: Personal history of urinary calculi: Z87.442

## 2019-01-24 HISTORY — DX: Nausea with vomiting, unspecified: R11.2

## 2019-01-24 HISTORY — DX: Other specified postprocedural states: Z98.890

## 2019-01-24 HISTORY — DX: Other complications of anesthesia, initial encounter: T88.59XA

## 2019-01-24 HISTORY — DX: Adverse effect of unspecified anesthetic, initial encounter: T41.45XA

## 2019-01-24 NOTE — H&P (Signed)
Chief Complaint: Sandra Richardson is a 68 y.o. (No LMP recorded. Patient is postmenopausal.) seen for  Chief Complaint  Patient presents with  . Follow-up    History of Present Illness: - Asx complex right ovarian cyst  She has not had prior imaging, CT scan, which was done on 01/04/2019 and demonstrated:  Impression: An incidental finding of potential clinical significance hasbeen found. Cystic mass within the right hemipelvis, suspicious forovarian neoplasm. There may be a solid component anteriorly,  suboptimally evaluated. Consider gynecology consultation and pelvicultrasound.    Ultrasound in our office today demonstrated:  Transabd pelvic (pt could not tolerate transvag)   Uterus=7.20 x 4.42 x 5.63cm  Uterus anteverted  Fibroid seen: posterior=2.7cm  Endometrium=4.18mm  No free fluid seen  Lt ovary appears wnl  Rt ovary contains 2 cysts: 1)simple=6.4cm 2)complex=4.2cm; septation=0.22cm  Rt ovary volume=359.36ml  Doppler performed on Rt ovary  Pertinent Hx: -No abnormal pap smear  Screening: Pap smear: 08/2018 neg with neg HPV   Past Obstetrical History:          OB History    Gravida  0   Para  0   Term  0   Preterm  0   AB  0   Living  0     SAB  0   TAB  0   Ectopic  0   Molar  0   Multiple  0   Live Births  0          Past Medical History:      Past Medical History:  Diagnosis Date  . Hypertension    Past Surgical History:       Past Surgical History:  Procedure Laterality Date  . ankle surgery    . COLONOSCOPY  06/11/2014   No Polyps - repeat 5 years per Dr. Rayann Heman  . TONSILLECTOMY     Social History:  SocialHistory  Social History        Socioeconomic History  . Marital status: Single    Spouse name: Not on file  . Number of children: Not on file  . Years of education: Not on file  . Highest education level: Not on file  Occupational History  . Occupation: tax office  for [employer]  . Financial resource strain: Not on file  . Food insecurity:    Worry: Not on file    Inability: Not on file  . Transportation needs:    Medical: Not on file    Non-medical: Not on file  Tobacco Use  . Smoking status: Never Smoker  . Smokeless tobacco: Never Used  Substance and Sexual Activity  . Alcohol use: No    Alcohol/week: 0.0 standard drinks  . Drug use: No  . Sexual activity: Defer  Lifestyle  . Physical activity:    Days per week: Not on file    Minutes per session: Not on file  . Stress: Not on file  Relationships  . Social connections:    Talks on phone: Not on file    Gets together: Not on file    Attends religious service: Not on file    Active member of club or organization: Not on file    Attends meetings of clubs or organizations: Not on file    Relationship status: Not on file  Other Topics Concern  . Not on file  Social History Narrative  . Not on file     Family History:  Family History  Problem Relation Age of Onset  . High blood pressure (Hypertension) Mother   . Other Mother        heart arrythmia  . Congenital heart disease Father   . COPD Father   . Prostate cancer Brother   , and she denies any female cancers, bleeding or blood clotting disorders.   Medications:  CurrentMedications        Current Outpatient Medications  Medication Sig Dispense Refill  . amLODIPine-benazepril (LOTREL) 10-20 mg capsule Take 1 capsule by mouth once daily.    Marland Kitchen atorvastatin (LIPITOR) 20 MG tablet Take by mouth    . CALCIUM CARBONATE/VITAMIN D3 (CALCIUM 500 + D, D3, ORAL) Take 2 tablets by mouth 2 (two) times daily.    . CYANOCOBALAMIN, VITAMIN B-12, (VITAMIN B-12 ORAL) Take 1 tablet by mouth once daily.    . peg-electrolyte (NULYTELY) solution Take 4,000 mLs by mouth as directed (as directed for colon prep). (Patient not taking: Reported on 09/05/2018 ) 4000 mL 0   No  current facility-administered medications for this visit.      Allergies: is allergic to voltaren [diclofenac sodium].  General: Well-developed, well-nourished  female in no acute distress Lungs: CTA  CV : RRR without murmur   Breast: deferred Neck:  no thyromegaly, no lymphadenopathy Abdomen: soft, no masses, normal active bowel sounds,  non-tender, no rebound tenderness, no hernias noted Genitalia:     Pelvic exam: deferred   Impression:   The encounter diagnosis was Complex ovarian cyst.  Plan:   Patient returns for a preoperative discussion regarding her plans to proceed with surgical treatment of her complex ovarian cyst by lap BSO with pelvic washings procedure. The patient and I discussed the technical aspects of the procedure including the potential for risks and complications. These include but are not limited to the risk of infection requiring post-operative antibiotics or further procedures. We talked about the risk of injury to adjacent organs including bladder, bowel, ureter, blood vessels or nerves. We talked about the need to convert to an open incision. We talked about the possible need for blood transfusion. We talked aboutpostop complications such asthromboembolic or cardiopulmonary complications. All of her questions were answered.  Her preoperative exam was completed and the appropriate consents were signed. She is scheduled to undergo this procedure in the near future.  Specific Peri-operative Considerations:  - Consent: obtained today - Labs: CBC, CMP preoperatively - Studies: EKG, CXR preoperatively - Bowel Preparation: None required - Abx:  None indicated - VTE ppx: SCDs perioperatively - Glucose Protocol: nA - Beta-blockade: n/a

## 2019-01-24 NOTE — Patient Instructions (Signed)
INSTRUCTIONS FOR SURGERY     Your surgery is scheduled for:  Friday,  January 27, 2019      To find out your arrival time for the day of surgery,          please call [phone] between 1 pm and 3 pm on : Thursday, January 26, 2019     When you arrive for surgery, report to the [facility].       Do NOT stop on the first floor to register.    REMEMBER: Instructions that are not followed completely may result in serious medical risk,  up to and including death, or upon the discretion of your surgeon and anesthesiologist,            your surgery may need to be rescheduled.  __X__ 1. Do not eat food after midnight the night before your procedure.                   No gum, candy, lozenger, tic tacs, tums or hard candies.                   ABSOLUTELY NOTHING SOLID IN YOUR MOUTH AFTER MIDNIGHT                   You may drink unlimited clear liquids up to 2 hours before you are scheduled to arrive for surgery.                   Do not drink anything within those 2 hours unless you need to take medicine, then take the                   smallest amount you need.  Clear liquids include:  water, apple juice without pulp,                   any flavor Gatorade, Black coffee, black tea.  Sugar may be added but no dairy/ honey /lemon.                        Broth and jello is not considered a clear liquid.  __x__  2. On the morning of surgery, please brush your teeth with toothpaste and water. You may rinse with                  mouthwash if you wish but DO NOT SWALLOW TOOTHPASTE OR MOUTHWASH  __X___3. NO alcohol for 24 hours before or after surgery.  __x___ 4.  Do NOT smoke or use e-cigarettes for 24 HOURS PRIOR TO SURGERY.                      DO NOT Use any chewable tobacco products for at least 6 hours prior to surgery.  __x___ 5. If you start any new medication after this appointment and prior to surgery, please           Bring it with you on the day of surgery.  ___x__ 6. Notify your doctor if there is any change in your medical condition, such as fever, infection, vomitting,  Diarrhea or any open sores.  __x___ 7.  USE the CHG SOAP as instructed, the night before surgery and the day of surgery.                   Once you have washed with this soap, do NOT use any of the following: Powders, perfumes                    or lotions. Please do not wear make up, hairpins, clips or nail polish. You MAY wear deodorant.                   Men may shave their face and neck.  Women need to shave 48 hours prior to surgery.                   DO NOT wear ANY jewelry on the day of surgery. If there are rings that are too tight to                    remove easily, please address this prior to the surgery day. Piercings need to be removed.                                                                     NO METAL ON YOUR BODY.                    Do NOT bring any valuables.  If you came to Pre-Admit testing then you will not need license,                     insurance card or credit card.  If you will be staying overnight, please either leave your things in                     the car or have your family be responsible for these items.                     [facility] IS NOT RESPONSIBLE FOR BELONGINGS OR VALUABLES.  ___X__ 8. DO NOT wear contact lenses on surgery day.  You may not have dentures,                     Hearing aides, contacts or glasses in the operating room. These items can be                    Placed in the Recovery Room to receive immediately after surgery.  __x___ 9. IF YOU ARE SCHEDULED TO GO HOME ON THE SAME DAY, YOU MUST                   Have someone to drive you home and to stay with you  for the first 24 hours.                    Have an arrangement prior to arriving on surgery day.  ___x__ 10. Take the following medications on the morning of surgery with a sip of water:  1. NONE                     2.                     3.                     4.                     5.                     6.  _____ 11.  Follow any instructions provided to you by your surgeon.                        Such as enema, clear liquid bowel prep  __X__  12. STOP  ASPIRIN AS OF: TODAY                       THIS INCLUDES BC POWDERS / GOODIES POWDER  __x___ 13. STOP Anti-inflammatories as of: TODAY                      This includes IBUPROFEN / MOTRIN / ADVIL / ALEVE/ NAPROXYN                    YOU MAY TAKE TYLENOL ANY TIME PRIOR TO SURGERY.  __X___ 14.  Stop supplements until after surgery.                    You may continue taking Vitamin B12 / Vitamin D3 but do not take on the morning of surgery.  _____ 15. Bring your CPAP machine into preop with you on the morning of surgery.  ______16.  Stop Metformin 2 full days prior to surgery.  Stop on:                     TAKE 1/2 OF USUAL INSULIN DOSE ON THE EVENING PRIOR TO SURGERY.                     Do NOT take any diabetes medications on surgery day.  __X____17.  Continue to take the following medications but do not take on the morning of surgery:                       LOTREL  ______18. If staying overnight, please have appropriate shoes to wear to be able to walk around the unit.                   Wear clean and comfortable clothing to the hospital. [place].

## 2019-01-27 ENCOUNTER — Encounter: Admission: RE | Payer: Self-pay | Source: Home / Self Care

## 2019-01-27 ENCOUNTER — Ambulatory Visit
Admission: RE | Admit: 2019-01-27 | Payer: Managed Care, Other (non HMO) | Source: Home / Self Care | Admitting: Obstetrics and Gynecology

## 2019-01-27 SURGERY — SALPINGO-OOPHORECTOMY, BILATERAL, LAPAROSCOPIC
Anesthesia: Choice | Laterality: Bilateral

## 2019-03-28 ENCOUNTER — Other Ambulatory Visit: Payer: Self-pay | Admitting: Internal Medicine

## 2019-03-28 DIAGNOSIS — E785 Hyperlipidemia, unspecified: Secondary | ICD-10-CM

## 2019-03-28 MED ORDER — ATORVASTATIN CALCIUM 20 MG PO TABS: 20.0000 mg | ORAL_TABLET | Freq: Every day | ORAL | 3 refills | Status: DC

## 2019-04-06 ENCOUNTER — Ambulatory Visit: Payer: Self-pay | Admitting: *Deleted

## 2019-04-06 ENCOUNTER — Encounter: Payer: Self-pay | Admitting: Emergency Medicine

## 2019-04-06 ENCOUNTER — Other Ambulatory Visit: Payer: Self-pay

## 2019-04-06 ENCOUNTER — Emergency Department
Admission: EM | Admit: 2019-04-06 | Discharge: 2019-04-06 | Disposition: A | Discharge status: Home / Self Care | Payer: Managed Care, Other (non HMO) | Attending: Emergency Medicine | Admitting: Emergency Medicine

## 2019-04-06 ENCOUNTER — Telehealth: Payer: Self-pay | Admitting: Internal Medicine

## 2019-04-06 DIAGNOSIS — R112 Nausea with vomiting, unspecified: Secondary | ICD-10-CM | POA: Diagnosis present

## 2019-04-06 DIAGNOSIS — I10 Essential (primary) hypertension: Secondary | ICD-10-CM | POA: Insufficient documentation

## 2019-04-06 DIAGNOSIS — Z79899 Other long term (current) drug therapy: Secondary | ICD-10-CM | POA: Diagnosis not present

## 2019-04-06 LAB — COMPREHENSIVE METABOLIC PANEL
ALT: 41 U/L (ref 0–44)
AST: 71 U/L — ABNORMAL HIGH (ref 15–41)
Albumin: 3.9 g/dL (ref 3.5–5.0)
Alkaline Phosphatase: 154 U/L — ABNORMAL HIGH (ref 38–126)
Anion gap: 12 (ref 5–15)
BUN: 21 mg/dL (ref 8–23)
CO2: 20 mmol/L — ABNORMAL LOW (ref 22–32)
Calcium: 11.3 mg/dL — ABNORMAL HIGH (ref 8.9–10.3)
Chloride: 103 mmol/L (ref 98–111)
Creatinine, Ser: 1.13 mg/dL — ABNORMAL HIGH (ref 0.44–1.00)
GFR calc Af Amer: 59 mL/min — ABNORMAL LOW (ref >60)
GFR calc non Af Amer: 51 mL/min — ABNORMAL LOW (ref >60)
Glucose, Bld: 126 mg/dL — ABNORMAL HIGH (ref 70–99)
Potassium: 4.5 mmol/L (ref 3.5–5.1)
Sodium: 135 mmol/L (ref 135–145)
Total Bilirubin: 1 mg/dL (ref 0.3–1.2)
Total Protein: 8.3 g/dL — ABNORMAL HIGH (ref 6.5–8.1)

## 2019-04-06 LAB — CBC
HCT: 43.3 % (ref 36.0–46.0)
Hemoglobin: 14.1 g/dL (ref 12.0–15.0)
MCH: 27.6 pg (ref 26.0–34.0)
MCHC: 32.6 g/dL (ref 30.0–36.0)
MCV: 84.9 fL (ref 80.0–100.0)
Platelets: 458 10*3/uL — ABNORMAL HIGH (ref 150–400)
RBC: 5.1 MIL/uL (ref 3.87–5.11)
RDW: 12.7 % (ref 11.5–15.5)
WBC: 14.7 10*3/uL — ABNORMAL HIGH (ref 4.0–10.5)
nRBC: 0 % (ref 0.0–0.2)

## 2019-04-06 LAB — URINALYSIS, COMPLETE (UACMP) WITH MICROSCOPIC
Bacteria, UA: NONE SEEN
Bilirubin Urine: NEGATIVE
Glucose, UA: NEGATIVE mg/dL
Hgb urine dipstick: NEGATIVE
Ketones, ur: 5 mg/dL — AB
Nitrite: NEGATIVE
Protein, ur: 100 mg/dL — AB
Specific Gravity, Urine: 1.024 (ref 1.005–1.030)
pH: 5 (ref 5.0–8.0)

## 2019-04-06 LAB — LIPASE, BLOOD: Lipase: 41 U/L (ref 11–51)

## 2019-04-06 MED ORDER — ONDANSETRON HCL 4 MG/2ML IJ SOLN
4.0000 mg | Freq: Once | INTRAMUSCULAR | Status: AC
  Administered 2019-04-06: 4 mg via INTRAVENOUS
  Filled 2019-04-06: qty 2

## 2019-04-06 MED ORDER — SODIUM CHLORIDE 0.9 % IV SOLN
1000.0000 mL | Freq: Once | INTRAVENOUS | Status: AC
  Administered 2019-04-06: 1000 mL via INTRAVENOUS

## 2019-04-06 MED ORDER — ONDANSETRON 4 MG PO TBDP: 4.0000 mg | ORAL_TABLET | Freq: Three times a day (TID) | ORAL | 0 refills | Status: DC | PRN

## 2019-04-06 NOTE — ED Triage Notes (Signed)
Vomiting X 3 days. Is able to keep water down. Unlabored, NAD at check in. Denies pain.  Reports increased nasal drainage. No diarrhea.

## 2019-04-06 NOTE — Telephone Encounter (Signed)
Please call to schedule telephone/video visit after ED for elevated calcium nausea/vomiting   I want to CC Dr. Leafy Ro and see when pt will be able to get surgery lap b/l salpingo oophorectomy with pelvic washings   [place]

## 2019-04-06 NOTE — ED Notes (Signed)
Pt given drink for PO challenge at this time, will continue to monitor

## 2019-04-06 NOTE — Telephone Encounter (Signed)
Called and spoke to pt.  Pt said that her nephew will be taking her to UC.

## 2019-04-06 NOTE — Telephone Encounter (Signed)
Pt reports nausea and vomiting, onset Monday evening. States 4-6 times daily, "But not eating anything." States "Everything comes right back up, even water." States tried to sip on Ginger ALe this AM, unable to keep down. Reports "Feels like a knot right below my breastbone area." Denies abdominal pain, distention. States "Runny nose." Afebrile. Reports mouth very dry and urinating less. Pt directed to ED/UC. States will go to UC, aware she may be directed to ED. States family will drive.  Reason for Disposition . [1] MODERATE vomiting (e.g., 3 - 5 times/day) AND [2] age > 40  Answer Assessment - Initial Assessment Questions 1. VOMITING SEVERITY: "How many times have you vomited in the past 24 hours?"     - MILD:  1 - 2 times/day    - MODERATE: 3 - 5 times/day, decreased oral intake without significant weight loss or symptoms of dehydration    - SEVERE: 6 or more times/day, vomits everything or nearly everything, with significant weight loss, symptoms of dehydration      4 times 2. ONSET: "When did the vomiting begin?"      Monday night 3. FLUIDS: "What fluids or food have you vomited up today?" "Have you been able to keep any fluids down?"     "Not much" 4. ABDOMINAL PAIN: "Are your having any abdominal pain?" If yes : "How bad is it and what does it feel like?" (e.g., crampy, dull, intermittent, constant)      "Feels like knot in stomach, sternal area." 5. DIARRHEA: "Is there any diarrhea?" If so, ask: "How many times today?"      no 6. CONTACTS: "Is there anyone else in the family with the same symptoms?"      no 7. CAUSE: "What do you think is causing your vomiting?"     No idea 8. HYDRATION STATUS: "Any signs of dehydration?" (e.g., dry mouth [not only dry lips], too weak to stand) "When did you last urinate?"     Mouth dry, urinating less 9. OTHER SYMPTOMS: "Do you have any other symptoms?" (e.g., fever, headache, vertigo, vomiting blood or coffee grounds, recent head injury)    Runny  nose  Protocols used: [facility]

## 2019-04-06 NOTE — ED Provider Notes (Signed)
[facility] Emergency Department Provider Note   ____________________________________________    I have reviewed the triage vital signs and the nursing notes.   HISTORY  Chief Complaint Emesis     HPI Sandra Richardson is a 68 y.o. female who presents with complaints of nausea and vomiting for 3 days.  Patient reports she is not able to tolerate any p.o.'s over the last several days.  However in the waiting room she was able to tolerate a small amount of soda.  She denies abdominal pain.  She does report some sinus drainage which she thinks may be related.  No fevers or chills.  No myalgias.  No sick contacts.  No recent travel.  No diarrhea or abnormal stools.  No history of GERD.  She does not take anything for this  Past Medical History:  Diagnosis Date   Allergy    Chicken pox    Complication of anesthesia    History of kidney stones    Hyperlipidemia    Hypertension    Liver cyst    Ovarian cyst    1979   PONV (postoperative nausea and vomiting)    UTI (urinary tract infection)     Patient Active Problem List   Diagnosis Date Noted   HLD (hyperlipidemia) 07/22/2018   Vitamin D deficiency 07/22/2018   Annual physical exam 07/22/2018   Leg edema, right 03/11/2018   HTN (hypertension) 03/10/2018   Sciatica of right side 03/10/2018   Morbid obesity ([place]) 03/10/2018    Past Surgical History:  Procedure Laterality Date   ANKLE SURGERY Right    1987.  all metal removed   GANGLION CYST EXCISION     70s/80s   TOE SURGERY     R great toe late 1990s. wire in foot maybe   [place]    Prior to Admission medications   Medication Sig Start Date End Date Taking? Authorizing Provider  acetaminophen (TYLENOL) 500 MG tablet Take 500-1,000 mg by mouth every 6 (six) hours as needed (pain.).    [provider]  amLODipine-benazepril (LOTREL) 10-20 MG capsule Take 1 capsule by mouth  daily. Patient taking differently: Take 1 capsule by mouth daily. In the morning 09/29/18   McLean-Scocuzza, Nino Glow, MD  atorvastatin (LIPITOR) 20 MG tablet Take 1 tablet (20 mg total) by mouth daily at 6 PM. 03/28/19   McLean-Scocuzza, Nino Glow, MD  Calcium 600-200 MG-UNIT tablet Take 1 tablet by mouth daily.    [provider]  calcium carbonate (OSCAL) 1500 (600 Ca) MG TABS tablet Take 600 mg by mouth daily.    [provider]  Cholecalciferol (VITAMIN D-3) 5000 units TABS Take 5,000 Units by mouth daily.     [provider]  ondansetron (ZOFRAN ODT) 4 MG disintegrating tablet Take 1 tablet (4 mg total) by mouth every 8 (eight) hours as needed. 04/06/19   Lavonia Drafts, MD     Allergies Diclofenac sodium and Seasonal ic [cholestatin]  Family History  Problem Relation Age of Onset   Heart disease Mother    Hypertension Mother    Hyperthyroidism Mother    Osteoporosis Mother    Heart disease Father    COPD Father    Arthritis Sister    Depression Sister    Hypertension Sister    Hypothyroidism Sister    Hyperthyroidism Brother        s/p ablation    Stroke Maternal Grandmother  Pneumonia Maternal Grandfather    Cancer Paternal Grandmother    Heart disease Paternal Grandfather    Cancer Brother    Breast cancer Neg Hx     Social History Social History   Tobacco Use   Smoking status: Never Smoker   Smokeless tobacco: Never Used  Substance Use Topics   Alcohol use: Not Currently    Alcohol/week: 0.0 standard drinks   Drug use: Not Currently    Review of Systems  Constitutional: No fever/chills Eyes: No visual changes.  ENT: Sinus drainage Cardiovascular: Denies chest pain. Respiratory: Denies shortness of breath. Gastrointestinal: As above Genitourinary: Negative for dysuria.  No frequency Musculoskeletal: Negative for back pain. Skin: Negative for rash. Neurological: Negative for headaches or  weakness   ____________________________________________   PHYSICAL EXAM:  VITAL SIGNS: ED Triage Vitals  Enc Vitals Group     BP 04/06/19 1143 114/60     Pulse Rate 04/06/19 1143 97     Resp --      Temp 04/06/19 1143 97.9 F (36.6 C)     Temp Source 04/06/19 1143 Oral     SpO2 04/06/19 1143 95 %     Weight 04/06/19 1135 116.6 kg (257 lb)     Height 04/06/19 1135 1.626 m (5' 4")     Head Circumference --      Peak Flow --      Pain Score 04/06/19 1135 0     Pain Loc --      Pain Edu? --      Excl. in [place]? --     Constitutional: Alert and oriented.    Nose: No congestion/rhinnorhea. Mouth/Throat: Mucous membranes are moist.    Cardiovascular: Normal rate, regular rhythm. Grossly normal heart sounds.  Good peripheral circulation. Respiratory: Normal respiratory effort.  No retractions. Lungs CTAB. Gastrointestinal: Soft and nontender. No distention.  No CVA tenderness.  Reassuring exam Musculoskeletal:  Warm and well perfused Neurologic:  Normal speech and language. No gross focal neurologic deficits are appreciated.  Skin:  Skin is warm, dry and intact. No rash noted. Psychiatric: Mood and affect are normal. Speech and behavior are normal.  ____________________________________________   LABS (all labs ordered are listed, but only abnormal results are displayed)  Labs Reviewed  COMPREHENSIVE METABOLIC PANEL - Abnormal; Notable for the following components:      Result Value   CO2 20 (*)    Glucose, Bld 126 (*)    Creatinine, Ser 1.13 (*)    Calcium 11.3 (*)    Total Protein 8.3 (*)    AST 71 (*)    Alkaline Phosphatase 154 (*)    GFR calc non Af Amer 51 (*)    GFR calc Af Amer 59 (*)    All other components within normal limits  CBC - Abnormal; Notable for the following components:   WBC 14.7 (*)    Platelets 458 (*)    All other components within normal limits  URINALYSIS, COMPLETE (UACMP) WITH MICROSCOPIC - Abnormal; Notable for the following components:    Color, Urine AMBER (*)    APPearance CLOUDY (*)    Ketones, ur 5 (*)    Protein, ur 100 (*)    Leukocytes,Ua TRACE (*)    Non Squamous Epithelial PRESENT (*)    All other components within normal limits  LIPASE, BLOOD   ____________________________________________  EKG  None ____________________________________________  RADIOLOGY  None ____________________________________________   PROCEDURES  Procedure(s) performed: No  Procedures   Critical Care performed:  No ____________________________________________   INITIAL IMPRESSION / ASSESSMENT AND PLAN / ED COURSE  Pertinent labs & imaging results that were available during my care of the patient were reviewed by me and considered in my medical decision making (see chart for details).  Patient presents with nausea and vomiting but without significant abdominal pain.  Lab work demonstrates mild elevated white blood cell count which is nonspecific, mild dehydration and some elevation of AST and alk phos.  Differential includes gastritis, viral illness, COVID-19.  Patient is having any myalgias or fevers which is reassuring.  She was able to tolerate a small amount of soda in the waiting room, we will treat with IV fluids, IV Zofran and reevaluate  ----------------------------------------- 4:57 PM on 04/06/2019 -----------------------------------------  Patient is tolerating p.o.'s, she feels significantly better, no further nausea, appropriate for discharge at this time will Rx Zofran, close outpatient follow-up, return precautions discussed    ____________________________________________   FINAL CLINICAL IMPRESSION(S) / ED DIAGNOSES  Final diagnoses:  Non-intractable vomiting with nausea, unspecified vomiting type        Note:  This document was prepared using Dragon voice recognition software and may include unintentional dictation errors.   Lavonia Drafts, MD 04/06/19 [phone]

## 2019-04-07 ENCOUNTER — Ambulatory Visit (INDEPENDENT_AMBULATORY_CARE_PROVIDER_SITE_OTHER): Payer: Managed Care, Other (non HMO) | Admitting: Internal Medicine

## 2019-04-07 DIAGNOSIS — Z1231 Encounter for screening mammogram for malignant neoplasm of breast: Secondary | ICD-10-CM

## 2019-04-07 DIAGNOSIS — R7303 Prediabetes: Secondary | ICD-10-CM

## 2019-04-07 DIAGNOSIS — Z1329 Encounter for screening for other suspected endocrine disorder: Secondary | ICD-10-CM

## 2019-04-07 DIAGNOSIS — R112 Nausea with vomiting, unspecified: Secondary | ICD-10-CM | POA: Diagnosis not present

## 2019-04-07 DIAGNOSIS — M5137 Other intervertebral disc degeneration, lumbosacral region: Secondary | ICD-10-CM | POA: Insufficient documentation

## 2019-04-07 DIAGNOSIS — D75839 Thrombocytosis, unspecified: Secondary | ICD-10-CM

## 2019-04-07 DIAGNOSIS — I10 Essential (primary) hypertension: Secondary | ICD-10-CM | POA: Diagnosis not present

## 2019-04-07 DIAGNOSIS — K7689 Other specified diseases of liver: Secondary | ICD-10-CM | POA: Insufficient documentation

## 2019-04-07 DIAGNOSIS — N281 Cyst of kidney, acquired: Secondary | ICD-10-CM | POA: Insufficient documentation

## 2019-04-07 DIAGNOSIS — E559 Vitamin D deficiency, unspecified: Secondary | ICD-10-CM

## 2019-04-07 DIAGNOSIS — D72829 Elevated white blood cell count, unspecified: Secondary | ICD-10-CM

## 2019-04-07 DIAGNOSIS — N83299 Other ovarian cyst, unspecified side: Secondary | ICD-10-CM | POA: Diagnosis not present

## 2019-04-07 DIAGNOSIS — D473 Essential (hemorrhagic) thrombocythemia: Secondary | ICD-10-CM

## 2019-04-07 MED ORDER — ONDANSETRON 4 MG PO TBDP: 4.0000 mg | ORAL_TABLET | Freq: Three times a day (TID) | ORAL | 0 refills | Status: DC | PRN

## 2019-04-07 NOTE — Progress Notes (Signed)
Virtual Visit via Video Note  I connected with Sandra Richardson   on 04/07/19 at  1:30 PM EDT by a video enabled telemedicine application and verified that I am speaking with the correct person using two identifiers.  Location patient: home Location provider:work Persons participating in the virtual visit: patient, provider  I discussed the limitations of evaluation and management by telemedicine and the availability of in person appointments. The patient expressed understanding and agreed to proceed.   HPI: 1. Monday patient had nausea vomiting of food contents and clear liquid 3x per day she went to the ED 04/06/2019. She also felt like she had a knot below her sternum which is resolved  Labs 04/06/2019 glucose 126, Cr 1.13, Ca 11.3 (she is taking extra calcium), AST 71, total protein 8.3, WBC 14.7, plts 458. Lipase normal 41  Will repeat labs in 2 weeks to make sure normalized She is feeling better today w/o any nausea able to tolerate toast, apple juice diluted with water and water and has taken 1 zofran today She denies constipation  She has had h/a today which I explained could be related to dehydration     ROS: See pertinent positives and negatives per HPI.  Past Medical History:  Diagnosis Date  . Allergy   . Chicken pox   . Complication of anesthesia   . History of kidney stones   . Hyperlipidemia   . Hypertension   . Liver cyst   . Ovarian cyst    1979  . PONV (postoperative nausea and vomiting)   . UTI (urinary tract infection)     Past Surgical History:  Procedure Laterality Date  . ANKLE SURGERY Right    1987.  all metal removed  . GANGLION CYST EXCISION     70s/80s  . TOE SURGERY     R great toe late 1990s. wire in foot maybe  . TONSILLECTOMY AND ADENOIDECTOMY     1972    Family History  Problem Relation Age of Onset  . Heart disease Mother   . Hypertension Mother   . Hyperthyroidism Mother   . Osteoporosis Mother   . Heart disease Father   . COPD Father    . Arthritis Sister   . Depression Sister   . Hypertension Sister   . Hypothyroidism Sister   . Hyperthyroidism Brother        s/p ablation   . Stroke Maternal Grandmother   . Pneumonia Maternal Grandfather   . Cancer Paternal Grandmother   . Heart disease Paternal Grandfather   . Cancer Brother   . Breast cancer Neg Hx     SOCIAL HX: lives alone    Current Outpatient Medications:  .  acetaminophen (TYLENOL) 500 MG tablet, Take 500-1,000 mg by mouth every 6 (six) hours as needed (pain.)., Disp: , Rfl:  .  amLODipine-benazepril (LOTREL) 10-20 MG capsule, Take 1 capsule by mouth daily. (Patient taking differently: Take 1 capsule by mouth daily. In the morning), Disp: 90 capsule, Rfl: 2 .  atorvastatin (LIPITOR) 20 MG tablet, Take 1 tablet (20 mg total) by mouth daily at 6 PM., Disp: 90 tablet, Rfl: 3 .  Cholecalciferol (VITAMIN D-3) 5000 units TABS, Take 5,000 Units by mouth daily. , Disp: , Rfl:  .  ondansetron (ZOFRAN ODT) 4 MG disintegrating tablet, Take 1 tablet (4 mg total) by mouth every 8 (eight) hours as needed for nausea or vomiting., Disp: 40 tablet, Rfl: 0  EXAM:  VITALS per patient if applicable:  GENERAL:  alert, oriented, appears well and in no acute distress  HEENT: atraumatic, conjunttiva clear, no obvious abnormalities on inspection of external nose and ears  NECK: normal movements of the head and neck  LUNGS: on inspection no signs of respiratory distress, breathing rate appears normal, no obvious gross SOB, gasping or wheezing  CV: no obvious cyanosis  MS: moves all visible extremities without noticeable abnormality  PSYCH/NEURO: pleasant and cooperative, no obvious depression or anxiety, speech and thought processing grossly intact  ASSESSMENT AND PLAN:  Discussed the following assessment and plan:  Nausea and vomiting, intractability of vomiting not specified, unspecified vomiting type - prn (ZOFRAN ODT) 4 MG disintegrating tablet tid prn  -check  calcium and vitamin D to make sure not elevated   Complex ovarian cyst right with HE4 lab 155 elevated (normal 96.5)surgery with Dr. Leafy Ro 05/05/2019 possibly   Essential hypertension - on lotrel 10-20 disc'ed in past reducing norvasc to 5 and increase benazapril to 40 mg has chronic right ankle swelling 2/2 edema   Hypercalcemia - Plan: Comprehensive metabolic panel stop all otc calcium for now   Leukocytosis, unspecified type - Plan: CBC w/Diff Thrombocytosis ([place]) - Plan: CBC w/Diff  Vitamin D deficiency - Plan: Vitamin D (25 hydroxy) pt will continue vitamin d for now   Prediabetes - Plan: Hemoglobin A1c   HTN BP in ed yesterday 144/70 check at f/u 05/24/2019   HM Flu shot at work 10 or 09/2018  prevnar utd pna 23 due 07/23/2019  Tdap 03/10/18  disc'ed shingrix prev  Declines hiv, hep b/c in the past  MMR immune   colonsocopy 06/11/14 IH/EH fair prep and diverticulosis [facility] rec repeat in 5 years  -disc due again at f/u prev prep made her feel nauseated   DEXA6/21/19 osteopenia on ca 600 mg qd (rec stop calcium due to hypercalcemia) and vit D 5000 IU qd  Pap10/28/19 normal   Mammogram10/3/19 normal referred for another   Never smoker  Eye Dr. Ellin Mayhew spring 2020  Dentist Dr. Leanor Kail    I discussed the assessment and treatment plan with the patient. The patient was provided an opportunity to ask questions and all were answered. The patient agreed with the plan and demonstrated an understanding of the instructions.   The patient was advised to call back or seek an in-person evaluation if the symptoms worsen or if the condition fails to improve as anticipated.  Time spent 15 minutes  Delorise Jackson, MD

## 2019-04-07 NOTE — Telephone Encounter (Signed)
Appointment scheduled today @ 1330

## 2019-04-10 ENCOUNTER — Ambulatory Visit: Admit: 2019-04-10 | Payer: Managed Care, Other (non HMO) | Admitting: Obstetrics and Gynecology

## 2019-04-10 SURGERY — SALPINGO-OOPHORECTOMY, BILATERAL, LAPAROSCOPIC
Anesthesia: Choice | Laterality: Bilateral

## 2019-04-11 ENCOUNTER — Inpatient Hospital Stay (HOSPITAL_COMMUNITY)
Admission: EM | Admit: 2019-04-11 | Discharge: 2019-04-20 | DRG: 754 | Disposition: A | Discharge status: Hospice | Payer: Managed Care, Other (non HMO) | Attending: Internal Medicine | Admitting: Internal Medicine

## 2019-04-11 ENCOUNTER — Emergency Department (HOSPITAL_COMMUNITY): Payer: Managed Care, Other (non HMO)

## 2019-04-11 ENCOUNTER — Other Ambulatory Visit: Payer: Self-pay

## 2019-04-11 ENCOUNTER — Ambulatory Visit (INDEPENDENT_AMBULATORY_CARE_PROVIDER_SITE_OTHER): Payer: Managed Care, Other (non HMO) | Admitting: Internal Medicine

## 2019-04-11 ENCOUNTER — Encounter (HOSPITAL_COMMUNITY): Payer: Self-pay

## 2019-04-11 DIAGNOSIS — Z8042 Family history of malignant neoplasm of prostate: Secondary | ICD-10-CM | POA: Diagnosis not present

## 2019-04-11 DIAGNOSIS — A419 Sepsis, unspecified organism: Secondary | ICD-10-CM | POA: Diagnosis present

## 2019-04-11 DIAGNOSIS — Z87442 Personal history of urinary calculi: Secondary | ICD-10-CM

## 2019-04-11 DIAGNOSIS — F419 Anxiety disorder, unspecified: Secondary | ICD-10-CM | POA: Diagnosis present

## 2019-04-11 DIAGNOSIS — Z1159 Encounter for screening for other viral diseases: Secondary | ICD-10-CM | POA: Diagnosis not present

## 2019-04-11 DIAGNOSIS — R197 Diarrhea, unspecified: Secondary | ICD-10-CM

## 2019-04-11 DIAGNOSIS — N179 Acute kidney failure, unspecified: Secondary | ICD-10-CM | POA: Diagnosis present

## 2019-04-11 DIAGNOSIS — N838 Other noninflammatory disorders of ovary, fallopian tube and broad ligament: Secondary | ICD-10-CM

## 2019-04-11 DIAGNOSIS — C561 Malignant neoplasm of right ovary: Principal | ICD-10-CM | POA: Diagnosis present

## 2019-04-11 DIAGNOSIS — Z825 Family history of asthma and other chronic lower respiratory diseases: Secondary | ICD-10-CM

## 2019-04-11 DIAGNOSIS — E739 Lactose intolerance, unspecified: Secondary | ICD-10-CM | POA: Diagnosis present

## 2019-04-11 DIAGNOSIS — Z823 Family history of stroke: Secondary | ICD-10-CM

## 2019-04-11 DIAGNOSIS — E860 Dehydration: Secondary | ICD-10-CM

## 2019-04-11 DIAGNOSIS — Z6841 Body Mass Index (BMI) 40.0 and over, adult: Secondary | ICD-10-CM | POA: Diagnosis not present

## 2019-04-11 DIAGNOSIS — D75839 Thrombocytosis, unspecified: Secondary | ICD-10-CM

## 2019-04-11 DIAGNOSIS — Z8262 Family history of osteoporosis: Secondary | ICD-10-CM

## 2019-04-11 DIAGNOSIS — R112 Nausea with vomiting, unspecified: Secondary | ICD-10-CM

## 2019-04-11 DIAGNOSIS — J9601 Acute respiratory failure with hypoxia: Secondary | ICD-10-CM | POA: Diagnosis not present

## 2019-04-11 DIAGNOSIS — I10 Essential (primary) hypertension: Secondary | ICD-10-CM | POA: Diagnosis present

## 2019-04-11 DIAGNOSIS — E785 Hyperlipidemia, unspecified: Secondary | ICD-10-CM | POA: Diagnosis present

## 2019-04-11 DIAGNOSIS — C7802 Secondary malignant neoplasm of left lung: Secondary | ICD-10-CM | POA: Diagnosis not present

## 2019-04-11 DIAGNOSIS — C787 Secondary malignant neoplasm of liver and intrahepatic bile duct: Secondary | ICD-10-CM | POA: Diagnosis present

## 2019-04-11 DIAGNOSIS — J181 Lobar pneumonia, unspecified organism: Secondary | ICD-10-CM | POA: Diagnosis present

## 2019-04-11 DIAGNOSIS — Z8261 Family history of arthritis: Secondary | ICD-10-CM

## 2019-04-11 DIAGNOSIS — Z888 Allergy status to other drugs, medicaments and biological substances status: Secondary | ICD-10-CM | POA: Diagnosis not present

## 2019-04-11 DIAGNOSIS — R918 Other nonspecific abnormal finding of lung field: Secondary | ICD-10-CM

## 2019-04-11 DIAGNOSIS — N189 Chronic kidney disease, unspecified: Secondary | ICD-10-CM | POA: Diagnosis present

## 2019-04-11 DIAGNOSIS — Z818 Family history of other mental and behavioral disorders: Secondary | ICD-10-CM | POA: Diagnosis not present

## 2019-04-11 DIAGNOSIS — Z1329 Encounter for screening for other suspected endocrine disorder: Secondary | ICD-10-CM | POA: Diagnosis not present

## 2019-04-11 DIAGNOSIS — D72829 Elevated white blood cell count, unspecified: Secondary | ICD-10-CM

## 2019-04-11 DIAGNOSIS — R7303 Prediabetes: Secondary | ICD-10-CM | POA: Diagnosis present

## 2019-04-11 DIAGNOSIS — D473 Essential (hemorrhagic) thrombocythemia: Secondary | ICD-10-CM

## 2019-04-11 DIAGNOSIS — Z7189 Other specified counseling: Secondary | ICD-10-CM

## 2019-04-11 DIAGNOSIS — C7800 Secondary malignant neoplasm of unspecified lung: Secondary | ICD-10-CM | POA: Diagnosis not present

## 2019-04-11 DIAGNOSIS — J309 Allergic rhinitis, unspecified: Secondary | ICD-10-CM

## 2019-04-11 DIAGNOSIS — Z515 Encounter for palliative care: Secondary | ICD-10-CM

## 2019-04-11 DIAGNOSIS — R079 Chest pain, unspecified: Secondary | ICD-10-CM

## 2019-04-11 DIAGNOSIS — Z8249 Family history of ischemic heart disease and other diseases of the circulatory system: Secondary | ICD-10-CM | POA: Diagnosis not present

## 2019-04-11 DIAGNOSIS — R160 Hepatomegaly, not elsewhere classified: Secondary | ICD-10-CM | POA: Diagnosis not present

## 2019-04-11 DIAGNOSIS — Z79899 Other long term (current) drug therapy: Secondary | ICD-10-CM

## 2019-04-11 DIAGNOSIS — J189 Pneumonia, unspecified organism: Secondary | ICD-10-CM

## 2019-04-11 DIAGNOSIS — E559 Vitamin D deficiency, unspecified: Secondary | ICD-10-CM

## 2019-04-11 DIAGNOSIS — J90 Pleural effusion, not elsewhere classified: Secondary | ICD-10-CM | POA: Diagnosis present

## 2019-04-11 DIAGNOSIS — C7801 Secondary malignant neoplasm of right lung: Secondary | ICD-10-CM | POA: Diagnosis not present

## 2019-04-11 DIAGNOSIS — Z66 Do not resuscitate: Secondary | ICD-10-CM | POA: Diagnosis not present

## 2019-04-11 DIAGNOSIS — Z806 Family history of leukemia: Secondary | ICD-10-CM | POA: Diagnosis not present

## 2019-04-11 DIAGNOSIS — C801 Malignant (primary) neoplasm, unspecified: Secondary | ICD-10-CM | POA: Diagnosis not present

## 2019-04-11 DIAGNOSIS — Z8051 Family history of malignant neoplasm of kidney: Secondary | ICD-10-CM | POA: Diagnosis not present

## 2019-04-11 DIAGNOSIS — N83299 Other ovarian cyst, unspecified side: Secondary | ICD-10-CM

## 2019-04-11 LAB — URINALYSIS, ROUTINE W REFLEX MICROSCOPIC
Bilirubin Urine: NEGATIVE
Glucose, UA: NEGATIVE mg/dL
Ketones, ur: 5 mg/dL — AB
Nitrite: NEGATIVE
Protein, ur: NEGATIVE mg/dL
Specific Gravity, Urine: 1.036 — ABNORMAL HIGH (ref 1.005–1.030)
pH: 5 (ref 5.0–8.0)

## 2019-04-11 LAB — COMPREHENSIVE METABOLIC PANEL
ALT: 63 U/L — ABNORMAL HIGH (ref 0–44)
AST: 140 U/L — ABNORMAL HIGH (ref 15–41)
Albumin: 3.4 g/dL — ABNORMAL LOW (ref 3.5–5.0)
Alkaline Phosphatase: 326 U/L — ABNORMAL HIGH (ref 38–126)
Anion gap: 14 (ref 5–15)
BUN: 26 mg/dL — ABNORMAL HIGH (ref 8–23)
CO2: 20 mmol/L — ABNORMAL LOW (ref 22–32)
Calcium: 12.4 mg/dL — ABNORMAL HIGH (ref 8.9–10.3)
Chloride: 98 mmol/L (ref 98–111)
Creatinine, Ser: 1.26 mg/dL — ABNORMAL HIGH (ref 0.44–1.00)
GFR calc Af Amer: 51 mL/min — ABNORMAL LOW (ref >60)
GFR calc non Af Amer: 44 mL/min — ABNORMAL LOW (ref >60)
Glucose, Bld: 101 mg/dL — ABNORMAL HIGH (ref 70–99)
Potassium: 4.6 mmol/L (ref 3.5–5.1)
Sodium: 132 mmol/L — ABNORMAL LOW (ref 135–145)
Total Bilirubin: 1 mg/dL (ref 0.3–1.2)
Total Protein: 7.6 g/dL (ref 6.5–8.1)

## 2019-04-11 LAB — CBC
HCT: 43.2 % (ref 36.0–46.0)
Hemoglobin: 13.7 g/dL (ref 12.0–15.0)
MCH: 27.3 pg (ref 26.0–34.0)
MCHC: 31.7 g/dL (ref 30.0–36.0)
MCV: 86.2 fL (ref 80.0–100.0)
Platelets: 448 10*3/uL — ABNORMAL HIGH (ref 150–400)
RBC: 5.01 MIL/uL (ref 3.87–5.11)
RDW: 13 % (ref 11.5–15.5)
WBC: 16.9 10*3/uL — ABNORMAL HIGH (ref 4.0–10.5)
nRBC: 0 % (ref 0.0–0.2)

## 2019-04-11 LAB — TROPONIN I: Troponin I: 0.03 ng/mL (ref <0.03)

## 2019-04-11 LAB — LIPASE, BLOOD: Lipase: 39 U/L (ref 11–51)

## 2019-04-11 LAB — SARS CORONAVIRUS 2 BY RT PCR (HOSPITAL ORDER, PERFORMED IN ~~LOC~~ HOSPITAL LAB): SARS Coronavirus 2: NEGATIVE

## 2019-04-11 MED ORDER — LACTATED RINGERS IV BOLUS
1000.0000 mL | Freq: Once | INTRAVENOUS | Status: AC
  Administered 2019-04-11: 19:00:00 1000 mL via INTRAVENOUS

## 2019-04-11 MED ORDER — IOHEXOL 300 MG/ML  SOLN
100.0000 mL | Freq: Once | INTRAMUSCULAR | Status: AC | PRN
  Administered 2019-04-11: 75 mL via INTRAVENOUS

## 2019-04-11 MED ORDER — ONDANSETRON 4 MG PO TBDP
4.0000 mg | ORAL_TABLET | Freq: Once | ORAL | Status: AC | PRN
  Administered 2019-04-11: 4 mg via ORAL
  Filled 2019-04-11: qty 1

## 2019-04-11 MED ORDER — ZOLEDRONIC ACID 4 MG/5ML IV CONC
INTRAVENOUS | Status: AC
  Filled 2019-04-11: qty 5

## 2019-04-11 MED ORDER — SODIUM CHLORIDE 0.9% FLUSH
3.0000 mL | Freq: Once | INTRAVENOUS | Status: AC
  Administered 2019-04-12: 3 mL via INTRAVENOUS

## 2019-04-11 MED ORDER — LACTATED RINGERS IV BOLUS
1000.0000 mL | Freq: Once | INTRAVENOUS | Status: AC
  Administered 2019-04-11: 20:00:00 1000 mL via INTRAVENOUS

## 2019-04-11 MED ORDER — SODIUM CHLORIDE 0.9 % IV BOLUS
1000.0000 mL | Freq: Once | INTRAVENOUS | Status: AC
  Administered 2019-04-11: 1000 mL via INTRAVENOUS

## 2019-04-11 MED ORDER — PROMETHAZINE HCL 25 MG PO TABS: 12.5000 mg | ORAL_TABLET | Freq: Three times a day (TID) | ORAL | 0 refills | Status: DC | PRN

## 2019-04-11 MED ORDER — ZOLEDRONIC ACID 4 MG/5ML IV CONC
3.5000 mg | Freq: Once | INTRAVENOUS | Status: AC
  Administered 2019-04-11: 3.5 mg via INTRAVENOUS
  Filled 2019-04-11: qty 4.38

## 2019-04-11 NOTE — ED Notes (Signed)
Patient transported to CT 

## 2019-04-11 NOTE — Progress Notes (Signed)
Virtual Visit via Video Note  I connected with Sandra Richardson   on 04/11/19 at  1:05 PM EDT by a video enabled telemedicine application and verified that I am speaking with the correct person using two identifiers.  Location patient: home Location provider:work  Persons participating in the virtual visit: patient, provider, niece Sandra Richardson   I discussed the limitations of evaluation and management by telemedicine and the availability of in person appointments. The patient expressed understanding and agreed to proceed.   HPI: X 1 week patient has c/o nausea, dry heaves and yesterday she had nausea, dry heaves and diarrhea 4 x with runny light yellow to brown stools with foul odor like something died.  She denies recently Abx use and her niece Sandra Richardson is by her side today. Yesterday she did eat oatmeal and applesauce. Today she has not been able to eat anything and was sweating this am, dry heaves and zofran is not helping. She feels weak and appetite down. She has not tried to eat food today  She reports she does have well water but well water has been tested and good quality. She denies sick contacts I.e COVID 19. She tried peptobismol in the past 1 week but has not tried today.  1 week she has tried bland food with nausea/vomiting and soft diet but over the weekend 04/07/19 she started not to feel right again. She c/o epigastric soreness she thinks its from dry heaving.  She has been hot w/o chills   VS able to check while on the phone HR 52 then 64 repeat and her nephew in law paramedic checked her BP today was 103/72   H/o allergies with runny nose and she used to be on allergy shots but has tried claritin otc   Ovarian complex cyst surgery has been put off due to [facility] OB[facility] date pushed back for removal   ROS: See pertinent positives and negatives per HPI.  Past Medical History:  Diagnosis Date  . Allergy   . Chicken pox   . Complication of anesthesia   . History of kidney stones   .  Hyperlipidemia   . Hypertension   . Liver cyst   . Ovarian cyst    1979  . PONV (postoperative nausea and vomiting)   . UTI (urinary tract infection)     Past Surgical History:  Procedure Laterality Date  . ANKLE SURGERY Right    1987.  all metal removed  . GANGLION CYST EXCISION     70s/80s  . TOE SURGERY     R great toe late 1990s. wire in foot maybe  . TONSILLECTOMY AND ADENOIDECTOMY     1972    Family History  Problem Relation Age of Onset  . Heart disease Mother   . Hypertension Mother   . Hyperthyroidism Mother   . Osteoporosis Mother   . Heart disease Father   . COPD Father   . Arthritis Sister   . Depression Sister   . Hypertension Sister   . Hypothyroidism Sister   . Hyperthyroidism Brother        s/p ablation   . Stroke Maternal Grandmother   . Pneumonia Maternal Grandfather   . Cancer Paternal Grandmother   . Heart disease Paternal Grandfather   . Cancer Brother   . Breast cancer Neg Hx     SOCIAL HX:   No kids  Owns guns  Wears seat belts  Safe in relationship  [employer] job  Current Outpatient Medications:  .  acetaminophen (TYLENOL) 500 MG tablet, Take 500-1,000 mg by mouth every 6 (six) hours as needed (pain.)., Disp: , Rfl:  .  amLODipine-benazepril (LOTREL) 10-20 MG capsule, Take 1 capsule by mouth daily. (Patient taking differently: Take 1 capsule by mouth daily. In the morning), Disp: 90 capsule, Rfl: 2 .  atorvastatin (LIPITOR) 20 MG tablet, Take 1 tablet (20 mg total) by mouth daily at 6 PM., Disp: 90 tablet, Rfl: 3 .  Cholecalciferol (VITAMIN D-3) 5000 units TABS, Take 5,000 Units by mouth daily. , Disp: , Rfl:  .  ondansetron (ZOFRAN ODT) 4 MG disintegrating tablet, Take 1 tablet (4 mg total) by mouth every 8 (eight) hours as needed for nausea or vomiting., Disp: 40 tablet, Rfl: 0 .  promethazine (PHENERGAN) 25 MG tablet, Take 0.5-1 tablets (12.5-25 mg total) by mouth every 8 (eight) hours as needed for nausea or  vomiting., Disp: 40 tablet, Rfl: 0 No current facility-administered medications for this visit.   Facility-Administered Medications Ordered in Other Visits:  .  sodium chloride flush (NS) 0.9 % injection 3 mL, 3 mL, Intravenous, Once, [name], MD  EXAM:  VITALS per patient if applicable: VS able to check while on the phone HR 52 then 64 repeat and her nephew in law paramedic checked her BP today was 103/72     GENERAL: alert, oriented, appears well and in no acute distress  HEENT: atraumatic, conjunttiva clear, no obvious abnormalities on inspection of external nose and ears  NECK: normal movements of the head and neck  LUNGS: on inspection no signs of respiratory distress, breathing rate appears normal, no obvious gross SOB, gasping or wheezing  CV: no obvious cyanosis  MS: moves all visible extremities without noticeable abnormality  PSYCH/NEURO: pleasant and cooperative, no obvious depression or anxiety, speech and thought processing grossly intact  ASSESSMENT AND PLAN:  Discussed the following assessment and plan:  1. Nausea vomiting and diarrhea likely dehydration due to 1 week of reduced po intake- Plan: Comprehensive metabolic panel, CBC w/Diff, Urinalysis, Routine w reflex microscopic, Troponin I, promethazine (PHENERGAN) 25 MG tablet  Diarrhea, unspecified type - Plan: Urinalysis, Routine w reflex microscopic, Clostridium Difficile by PCR([facility]), Ova and parasite examination, Stool Culture  -we discussed if she was too weak to go to the hospital for further w/u and tx but she will try to go to [facility] to get labs  -I rec consider stool w/u if diarrhea continues  -I rec consider vitamin D if hypercalcemia continues to see if could be getting too much vitamin D  -IVF -nausea control change from prn zofran to phenerghan  -considered ordering troponin for weakness and nausea, epigastric pain as well    2. Thrombocytosis ([place]) - Plan: CBC  w/Diff Leukocytosis, unspecified type - Plan: CBC w/Diff  3. Hypercalcemia - Plan: Comprehensive metabolic panel, Vitamin D (25 hydroxy)  4. Complex ovarian cysts [facility] surgery pending and moved back due to COVID 19 pandemic  HE4 marker elevated 155 (normal range 0-96.5) slightly high which is a marker for ovarian cancer  -rec if possible once stable get this out for pathology    5. Allergic rhinitis  Prn claritin  Of note she used to get allergy shots in the past   I discussed the assessment and treatment plan with the patient. The patient was provided an opportunity to ask questions and all were answered. The patient agreed with the plan and demonstrated an understanding of the instructions.   The  patient was advised to call back or seek an in-person evaluation if the symptoms worsen or if the condition fails to improve as anticipated.  Time spent 15 minutes  Delorise Jackson, MD

## 2019-04-11 NOTE — H&P (Signed)
History and Physical:    Sandra Richardson   YSA:[id number] DOB: Mar 30, 1951 DOA: 04/11/2019  Referring MD/provider: Dr Regenia Skeeter PCP: McLean-Scocuzza, Nino Glow, MD   Patient coming from: Home  Chief Complaint: Nausea vomiting and weakness for 8 days  History of Present Illness:   Sandra Richardson is an 68 y.o. female with past medical history significant for complex ovarian cyst who was in her usual state of health until about 8 days ago when she started develop weakness, nausea and vomiting.  She was unable to tolerate much food because of anorexia and the nausea.  She was seen at an outside ED last week where she was noted to have a mildly elevated calcium at 11.5 and was treated with IV hydration and Zofran with temporary improvement.  Patient was discharged home on Zofran however the nausea has worsened and is not being able to be controlled on the Zofran alone.  Patient now comes in for management of weakness nausea and vomiting.  She denies fevers or chills.  She denies any blood in her vomitus.  Denies any abdominal pain or swelling.  Does admit to a 20 pound weight loss over the past couple of months which was unintentional.  Denies any lower extremity edema.  No new cough.  Chest pain other than when she is dry heaving.  ED Course:  The patient is noted to have an elevated calcium at 12.5 in the ED.  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  Patient is being admitted for dehydration, hypercalcemia and work-up of her likely ovarian malignancy.  ROS:   ROS   Review of Systems: General: No fever, chills,  Respiratory: No cough,, shortness of breath, hemoptysis Cardiovascular: No palpitations, chest pain GI: No diarrhea, constipation GU: No dysuria, increased frequency   Past Medical History:   Past Medical History:  Diagnosis Date   Allergy    Chicken pox    Complication of anesthesia    History of kidney stones    Hyperlipidemia      Hypertension    Liver cyst    Ovarian cyst    1979   PONV (postoperative nausea and vomiting)    UTI (urinary tract infection)     Past Surgical History:   Past Surgical History:  Procedure Laterality Date   ANKLE SURGERY Right    1987.  all metal removed   GANGLION CYST EXCISION     70s/80s   TOE SURGERY     R great toe late 1990s. 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  Social Needs   Financial resource strain: Not on file   Food insecurity:    Worry: Not on file    Inability: Not on file   Transportation needs:    Medical: Not on file    Non-medical: Not on file  Tobacco Use   Smoking status: Never Smoker   Smokeless tobacco: Never Used  Substance and Sexual Activity   Alcohol use: Not Currently    Alcohol/week: 0.0 standard drinks  Drug use: Not Currently   Sexual activity: Not Currently  Lifestyle   Physical activity:    Days per week: Not on file    Minutes per session: Not on file   Stress: Not on file  Relationships   Social connections:    Talks on phone: Not on file    Gets together: Not on file    Attends religious service: Not on file    Active member of club or organization: Not on file    Attends meetings of clubs or organizations: Not on file    Relationship status: Not on file   Intimate partner violence:    Fear of current or ex partner: Not on file    Emotionally abused: Not on file    Physically abused: Not on file    Forced sexual activity: Not on file  Other Topics Concern   Not on file  Social History Narrative   No kids    Owns guns    Wears seat belts    Safe in  relationship    College ed    Government job     Allergies   Diclofenac sodium and Seasonal ic [cholestatin]  Family history:   Family History  Problem Relation Age of Onset   Heart disease Mother    Hypertension Mother    Hyperthyroidism Mother    Osteoporosis Mother    Heart disease Father    COPD Father    Arthritis Sister    Depression Sister    Hypertension Sister    Hypothyroidism Sister    Hyperthyroidism Brother        s/p ablation    Stroke Maternal Grandmother    Pneumonia Maternal Grandfather    Cancer Paternal Grandmother    Heart disease Paternal Grandfather    Cancer Brother    Breast cancer Neg Hx     Current Medications:   Prior to Admission medications   Medication Sig Start Date End Date Taking? Authorizing Provider  acetaminophen (TYLENOL) 500 MG tablet Take 500-1,000 mg by mouth every 6 (six) hours as needed (pain.).   Yes [provider]  amLODipine-benazepril (LOTREL) 10-20 MG capsule Take 1 capsule by mouth daily. Patient taking differently: Take 1 capsule by mouth daily. In the morning 09/29/18  Yes McLean-Scocuzza, Nino Glow, MD  atorvastatin (LIPITOR) 20 MG tablet Take 1 tablet (20 mg total) by mouth daily at 6 PM. 03/28/19  Yes McLean-Scocuzza, Nino Glow, MD  Cholecalciferol (VITAMIN D-3) 5000 units TABS Take 5,000 Units by mouth daily.    Yes [provider]  ondansetron (ZOFRAN ODT) 4 MG disintegrating tablet Take 1 tablet (4 mg total) by mouth every 8 (eight) hours as needed for nausea or vomiting. 04/07/19  Yes McLean-Scocuzza, Nino Glow, MD  promethazine (PHENERGAN) 25 MG tablet Take 0.5-1 tablets (12.5-25 mg total) by mouth every 8 (eight) hours as needed for nausea or vomiting. 04/11/19   McLean-Scocuzza, Nino Glow, MD    Physical Exam:   Vitals:   04/11/19 1543 04/11/19 1924  BP: 103/72 138/66  Pulse: 75 92  Resp:  (!) 23  Temp: (!) 97.2 F (36.2 C)   TempSrc: Oral   SpO2: (!) 89% 95%  Weight: 116.6 kg    Height: 5\' 4"  (1.626 m)      Physical Exam: Blood pressure 138/66, pulse 92, temperature (!) 97.2 F (36.2 C), temperature source Oral, resp. rate (!) 23, height 5\' 4"  (1.626 m), weight 116.6 kg, SpO2 95 %. Gen: Obese  female lying flat in bed in no acute respiratory distress. Eyes: Sclerae anicteric. Conjunctiva mildly injected. Chest: Moderately good air entry bilaterally with no adventitious sounds.  CV: Distant, regular, no audible murmurs. Abdomen: Obese, NABS, soft, nontender. No tenderness to light or deep palpation. No rebound, no guarding. Extremities: No edema.  Neuro: Alert and oriented times 3; grossly nonfocal. Psych: Patient is cooperative, logical and coherent with appropriate mood and affect.  Data Review:    Labs: Basic Metabolic Panel: Recent Labs  Lab 04/06/19 1144 04/11/19 1621  NA 135 132*  K 4.5 4.6  CL 103 98  CO2 20* 20*  GLUCOSE 126* 101*  BUN 21 26*  CREATININE 1.13* 1.26*  CALCIUM 11.3* 12.4*   Liver Function Tests: Recent Labs  Lab 04/06/19 1144 04/11/19 1621  AST 71* 140*  ALT 41 63*  ALKPHOS 154* 326*  BILITOT 1.0 1.0  PROT 8.3* 7.6  ALBUMIN 3.9 3.4*   Recent Labs  Lab 04/06/19 1144 04/11/19 1621  LIPASE 41 39   No results for input(s): AMMONIA in the last 168 hours. CBC: Recent Labs  Lab 04/06/19 1144 04/11/19 1621  WBC 14.7* 16.9*  HGB 14.1 13.7  HCT 43.3 43.2  MCV 84.9 86.2  PLT 458* 448*   Cardiac Enzymes: Recent Labs  Lab 04/11/19 1621  TROPONINI 0.03*    BNP (last 3 results) No results for input(s): PROBNP in the last 8760 hours. CBG: No results for input(s): GLUCAP in the last 168 hours.  Urinalysis    Component Value Date/Time   COLORURINE AMBER (A) 04/06/2019 1144   APPEARANCEUR CLOUDY (A) 04/06/2019 1144   APPEARANCEUR Clear 09/28/2018 0937   LABSPEC 1.024 04/06/2019 1144   PHURINE 5.0 04/06/2019 1144   GLUCOSEU NEGATIVE 04/06/2019 1144   HGBUR NEGATIVE 04/06/2019 1144   BILIRUBINUR NEGATIVE  04/06/2019 1144   BILIRUBINUR Negative 09/28/2018 0937   KETONESUR 5 (A) 04/06/2019 1144   PROTEINUR 100 (A) 04/06/2019 1144   UROBILINOGEN 0.2 06/16/2018 1010   NITRITE NEGATIVE 04/06/2019 1144   LEUKOCYTESUR TRACE (A) 04/06/2019 1144      Radiographic Studies: Ct Abdomen Pelvis W Contrast  Result Date: 04/11/2019 CLINICAL DATA:  Nausea and vomiting. EXAM: CT ABDOMEN AND PELVIS WITH CONTRAST TECHNIQUE: Multidetector CT imaging of the abdomen and pelvis was performed using the standard protocol following bolus administration of intravenous contrast. CONTRAST:  53mL OMNIPAQUE IOHEXOL 300 MG/ML  SOLN COMPARISON:  CT dated 01/04/2019. FINDINGS: Lower chest: There are innumerable pulmonary nodules involving the partially visualized lung bases. There is a trace right-sided pleural effusion. The heart size is mildly enlarged. Hepatobiliary: Multiple presumed liver cysts are again identified. There is a somewhat heterogeneous appearance of the liver parenchyma with findings suspicious for underlying metastatic lesions. These are not well appreciated on this exam secondary to contrast bolus timing. The liver size has increased from prior study currently measuring approximately 21.1 cm (previously measuring 15.3 cm). The gallbladder is unremarkable. Pancreas: Unremarkable. No pancreatic ductal dilatation or surrounding inflammatory changes. Spleen: Normal in size without focal abnormality. Adrenals/Urinary Tract: There is mild nodularity of the left adrenal gland which is stable from prior studies. There is no hydronephrosis. The right adrenal gland is unremarkable. The bladder is mostly decompressed which limits evaluation. Stomach/Bowel: There is severe sigmoid diverticulosis without CT evidence of diverticulitis. The appendix is unremarkable. There is no small bowel obstruction. The stomach is unremarkable. Vascular/Lymphatic: There are few mildly prominent retroperitoneal lymph nodes measuring up to  approximately 9 mm (axial series 2,  image 46). Atherosclerotic changes are noted of the thoracic aorta and its branch vessels. Reproductive: There is a complex solid and cystic mass involving the right ovary currently measuring approximately 12.2 by 9.2 cm on the sagittal series (previously measuring approximately 10.6 x 8 cm.) Other: No abdominal wall hernia or abnormality. No abdominopelvic ascites. Musculoskeletal: No acute or significant osseous findings. IMPRESSION: 1. 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 There is a complex right ovarian mass which has increased in size from prior study dated 01/04/2019. In addition, there are innumerable new small pulmonary nodules bilaterally concerning for metastatic disease to the lungs. There is a heterogeneous appearance to the liver with interval increase in size from prior study, highly suspicious for underlying hepatic metastatic disease. 2. Severe sigmoid diverticulosis without CT evidence of diverticulitis. Electronically Signed   By: Constance Holster M.D.   On: 04/11/2019 20:07   Dg Chest Portable 1 View  Result Date: 04/11/2019 CLINICAL DATA:  Vomiting. EXAM: PORTABLE CHEST 1 VIEW COMPARISON:  None. FINDINGS: The heart size is enlarged. The lung volumes are low. There are prominent interstitial lung markings bilaterally. There is likely atelectasis or scarring at the lung bases. No acute osseous abnormality. No pneumothorax. IMPRESSION: Mild cardiac enlargement with low lung volumes. Airspace opacity at the right lung base is favored to represent atelectasis. Electronically Signed   By: Constance Holster M.D.   On: 04/11/2019 19:11    EKG: Independently reviewed.  Sinus rhythm at 98.  Isolated Q with T wave inversions in 3.  Normal intervals.  Normal axis. Diffusely flattened T waves.  Assessment/Plan:   Active Problems:   Hypercalcemia of malignancy   Nausea and vomiting   Ovarian mass   Multiple lung nodules on  CT  HYPERCALCEMIA Nausea and vomiting most likely secondary to hypercalcemia which is also likely causing dehydration Treat with aggressive fluid resuscitation Zometa 4 mg IV x1 We will keep patient n.p.o. until nausea and vomiting are better controlled Zofran PRN  LIKELY  MALIGNANCY Will admit patient to [facility] long hospital where she can have a GYN malignancy consultation in the morning. They are of her CT scan results and likely diagnosis.  HTN We will hold patient's antihypertensives for now as patient is likely intravascularly volume depleted These can be restarted once her blood pressure rebounds.       Other information:   DVT prophylaxis: Lovenox ordered. Code Status: Full code. Family Communication: Patient states her daughter knows that she is in the hospital Disposition Plan: Home Consults called: None Admission status: Inpatient    The medical decision making is of moderate complexity, therefore this is a level 2 visit.  Dewaine Oats Tublu Armentha Branagan [facility]  If 7PM-7AM, please contact night-coverage [email or web address] Password [facility] 04/11/2019, 9:29 PM

## 2019-04-11 NOTE — ED Triage Notes (Signed)
Pt c/o vomiting since last Monday and started diarrhea started last night. Pt denies abd pain and fever.

## 2019-04-11 NOTE — ED Provider Notes (Signed)
[facility] EMERGENCY DEPARTMENT Provider Note   CSN: [id number] Arrival date & time: 04/11/19  1513    History   Chief Complaint Chief Complaint  Patient presents with  . Emesis    HPI Sandra Richardson is a 68 y.o. female.     HPI  68 year old female presents with vomiting.  She is been vomiting for about 8 days.  3 or 4 times a day.  No blood.  She gets some epigastric abdominal pain and chest pain while vomiting and then it goes away.  She had some diarrhea starting last night, 3 or 4 episodes.  No blood.  No back pain or urinary symptoms besides decreased urine and some poor smell.  No fevers.  No shortness of breath or cough.  Felt like her legs might give out on her this morning and feels a little generally weak but otherwise no weakness or numbness. She stopped her calcium supplements last week at the direction of her doctor after ED visit showed hypercalcemia. Still on vitamin D supplements.  Past Medical History:  Diagnosis Date  . Allergy   . Chicken pox   . Complication of anesthesia   . History of kidney stones   . Hyperlipidemia   . Hypertension   . Liver cyst   . Ovarian cyst    1979  . PONV (postoperative nausea and vomiting)   . UTI (urinary tract infection)     Patient Active Problem List   Diagnosis Date Noted  . Allergic rhinitis 04/11/2019  . Complex ovarian cyst 04/07/2019  . Liver cyst 04/07/2019  . Kidney cysts 04/07/2019  . DDD (degenerative disc disease), lumbosacral 04/07/2019  . Prediabetes 04/07/2019  . HLD (hyperlipidemia) 07/22/2018  . Vitamin D deficiency 07/22/2018  . Annual physical exam 07/22/2018  . Leg edema, right 03/11/2018  . HTN (hypertension) 03/10/2018  . Sciatica of right side 03/10/2018  . Morbid obesity ([place]) 03/10/2018    Past Surgical History:  Procedure Laterality Date  . ANKLE SURGERY Right    1987.  all metal removed  . GANGLION CYST EXCISION     70s/80s  . TOE SURGERY     R great toe late 1990s. wire in foot  maybe  . TONSILLECTOMY AND ADENOIDECTOMY     1972     OB History   No obstetric history on file.      Home Medications    Prior to Admission medications   Medication Sig Start Date End Date Taking? Authorizing Provider  acetaminophen (TYLENOL) 500 MG tablet Take 500-1,000 mg by mouth every 6 (six) hours as needed (pain.).    [provider]  amLODipine-benazepril (LOTREL) 10-20 MG capsule Take 1 capsule by mouth daily. Patient taking differently: Take 1 capsule by mouth daily. In the morning 09/29/18   McLean-Scocuzza, Nino Glow, MD  atorvastatin (LIPITOR) 20 MG tablet Take 1 tablet (20 mg total) by mouth daily at 6 PM. 03/28/19   McLean-Scocuzza, Nino Glow, MD  Cholecalciferol (VITAMIN D-3) 5000 units TABS Take 5,000 Units by mouth daily.     [provider]  ondansetron (ZOFRAN ODT) 4 MG disintegrating tablet Take 1 tablet (4 mg total) by mouth every 8 (eight) hours as needed for nausea or vomiting. 04/07/19   McLean-Scocuzza, Nino Glow, MD  promethazine (PHENERGAN) 25 MG tablet Take 0.5-1 tablets (12.5-25 mg total) by mouth every 8 (eight) hours as needed for nausea or vomiting. 04/11/19   McLean-Scocuzza, Nino Glow, MD    Family History Family History  Problem Relation Age of Onset  . Heart disease Mother   . Hypertension Mother   . Hyperthyroidism Mother   . Osteoporosis Mother   . Heart disease Father   . COPD Father   . Arthritis Sister   . Depression Sister   . Hypertension Sister   . Hypothyroidism Sister   . Hyperthyroidism Brother        s/p ablation   . Stroke Maternal Grandmother   . Pneumonia Maternal Grandfather   . Cancer Paternal Grandmother   . Heart disease Paternal Grandfather   . Cancer Brother   . Breast cancer Neg Hx     Social History Social History   Tobacco Use  . Smoking status: Never Smoker  . Smokeless tobacco: Never Used  Substance Use Topics  . Alcohol use: Not Currently    Alcohol/week: 0.0 standard drinks  . Drug use: Not  Currently     Allergies   Diclofenac sodium and Seasonal ic [cholestatin]   Review of Systems Review of Systems  Constitutional: Negative for fever.  Respiratory: Negative for shortness of breath.   Cardiovascular: Positive for chest pain (when vomiting).  Gastrointestinal: Positive for abdominal pain, diarrhea, nausea and vomiting. Negative for blood in stool.  Genitourinary: Negative for dysuria.  Musculoskeletal: Negative for back pain.  Neurological: Positive for weakness. Negative for light-headedness.  All other systems reviewed and are negative.    Physical Exam Updated Vital Signs BP 103/72 (BP Location: Right Arm)   Pulse 75   Temp (!) 97.2 F (36.2 C) (Oral)   Ht 5\' 4"  (1.626 m)   Wt 116.6 kg   LMP  (LMP Unknown)   SpO2 (!) 89%   BMI 44.11 kg/m   Physical Exam Vitals signs and nursing note reviewed.  Constitutional:      General: She is not in acute distress.    Appearance: She is well-developed. She is obese. She is not ill-appearing or diaphoretic.  HENT:     Head: Normocephalic and atraumatic.     Right Ear: External ear normal.     Left Ear: External ear normal.     Nose: Nose normal.  Eyes:     General:        Right eye: No discharge.        Left eye: No discharge.  Cardiovascular:     Rate and Rhythm: Normal rate and regular rhythm.     Heart sounds: Normal heart sounds.  Pulmonary:     Effort: Pulmonary effort is normal.     Breath sounds: Normal breath sounds.  Abdominal:     Palpations: Abdomen is soft.     Tenderness: There is abdominal tenderness in the epigastric area.  Skin:    General: Skin is warm and dry.  Neurological:     Mental Status: She is alert.  Psychiatric:        Mood and Affect: Mood is not anxious.      ED Treatments / Results  Labs (all labs ordered are listed, but only abnormal results are displayed) Labs Reviewed  COMPREHENSIVE METABOLIC PANEL - Abnormal; Notable for the following components:      Result  Value   Sodium 132 (*)    CO2 20 (*)    Glucose, Bld 101 (*)    BUN 26 (*)    Creatinine, Ser 1.26 (*)    Calcium 12.4 (*)    Albumin 3.4 (*)    AST 140 (*)    ALT 63 (*)  Alkaline Phosphatase 326 (*)    GFR calc non Af Amer 44 (*)    GFR calc Af Amer 51 (*)    All other components within normal limits  CBC - Abnormal; Notable for the following components:   WBC 16.9 (*)    Platelets 448 (*)    All other components within normal limits  TROPONIN I - Abnormal; Notable for the following components:   Troponin I 0.03 (*)    All other components within normal limits  SARS CORONAVIRUS 2 (HOSPITAL ORDER, [facility])  LIPASE, BLOOD  URINALYSIS, ROUTINE W REFLEX MICROSCOPIC  CA 125  CALCIUM, IONIZED    EKG EKG Interpretation  Date/Time:  Tuesday April 11 2019 18:56:07 EDT Ventricular Rate:  90 PR Interval:    QRS Duration: 82 QT Interval:  330 QTC Calculation: 404 R Axis:   33 Text Interpretation:  Sinus rhythm Borderline short PR interval Low voltage, precordial leads Abnormal R-wave progression, early transition Borderline T abnormalities, diffuse leads Confirmed by Sherwood Gambler [phone]) on 04/11/2019 7:02:42 PM   Radiology Ct Abdomen Pelvis W Contrast  Result Date: 04/11/2019 CLINICAL DATA:  Nausea and vomiting. EXAM: CT ABDOMEN AND PELVIS WITH CONTRAST TECHNIQUE: Multidetector CT imaging of the abdomen and pelvis was performed using the standard protocol following bolus administration of intravenous contrast. CONTRAST:  74mL OMNIPAQUE IOHEXOL 300 MG/ML  SOLN COMPARISON:  CT dated 01/04/2019. FINDINGS: Lower chest: There are innumerable pulmonary nodules involving the partially visualized lung bases. There is a trace right-sided pleural effusion. The heart size is mildly enlarged. Hepatobiliary: Multiple presumed liver cysts are again identified. There is a somewhat heterogeneous appearance of the liver parenchyma with findings suspicious for  underlying metastatic lesions. These are not well appreciated on this exam secondary to contrast bolus timing. The liver size has increased from prior study currently measuring approximately 21.1 cm (previously measuring 15.3 cm). The gallbladder is unremarkable. Pancreas: Unremarkable. No pancreatic ductal dilatation or surrounding inflammatory changes. Spleen: Normal in size without focal abnormality. Adrenals/Urinary Tract: There is mild nodularity of the left adrenal gland which is stable from prior studies. There is no hydronephrosis. The right adrenal gland is unremarkable. The bladder is mostly decompressed which limits evaluation. Stomach/Bowel: There is severe sigmoid diverticulosis without CT evidence of diverticulitis. The appendix is unremarkable. There is no small bowel obstruction. The stomach is unremarkable. Vascular/Lymphatic: There are few mildly prominent retroperitoneal lymph nodes measuring up to approximately 9 mm (axial series 2, image 46). Atherosclerotic changes are noted of the thoracic aorta and its branch vessels. Reproductive: There is a complex solid and cystic mass involving the right ovary currently measuring approximately 12.2 by 9.2 cm on the sagittal series (previously measuring approximately 10.6 x 8 cm.) Other: No abdominal wall hernia or abnormality. No abdominopelvic ascites. Musculoskeletal: No acute or significant osseous findings. IMPRESSION: 1. 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 There is a complex right ovarian mass which has increased in size from prior study dated 01/04/2019. In addition, there are innumerable new small pulmonary nodules bilaterally concerning for metastatic disease to the lungs. There is a heterogeneous appearance to the liver with interval increase in size from prior study, highly suspicious for underlying hepatic metastatic disease. 2. Severe sigmoid diverticulosis without CT evidence of diverticulitis. Electronically  Signed   By: Constance Holster M.D.   On: 04/11/2019 20:07   Dg Chest Portable 1 View  Result Date: 04/11/2019 CLINICAL DATA:  Vomiting. EXAM: PORTABLE CHEST 1 VIEW  COMPARISON:  None. FINDINGS: The heart size is enlarged. The lung volumes are low. There are prominent interstitial lung markings bilaterally. There is likely atelectasis or scarring at the lung bases. No acute osseous abnormality. No pneumothorax. IMPRESSION: Mild cardiac enlargement with low lung volumes. Airspace opacity at the right lung base is favored to represent atelectasis. Electronically Signed   By: Constance Holster M.D.   On: 04/11/2019 19:11    Procedures Procedures (including critical care time)  Medications Ordered in ED Medications  sodium chloride flush (NS) 0.9 % injection 3 mL (has no administration in time range)  lactated ringers bolus 1,000 mL (has no administration in time range)  ondansetron (ZOFRAN-ODT) disintegrating tablet 4 mg (4 mg Oral Given 04/11/19 1547)     Initial Impression / Assessment and Plan / ED Course  I have reviewed the triage vital signs and the nursing notes.  Pertinent labs & imaging results that were available during my care of the patient were reviewed by me and considered in my medical decision making (see chart for details).        Patient's dehydration has likely contributed to hypercalcemia.  She does not have any altered mental status but her generalized weakness is probably related to this.  She is not still on calcium though is still on the vitamin D and this will need to be stopped.  With the worsening WBC and continued vomiting, CT obtained and shows concern for metastatic ovarian cancer.  I discussed this with her.  She will need treatment of her hypercalcemia and make sure it is going down with fluids.  Should go to [facility] and have a gynecology oncology consult tomorrow.  Discussed with hospitalist Dr. Jamse Arn who will admit.  Final Clinical Impressions(s) /  ED Diagnoses   Final diagnoses:  Hypercalcemia    ED Discharge Orders    None       Sherwood Gambler, MD 04/11/19 2202

## 2019-04-12 ENCOUNTER — Inpatient Hospital Stay (HOSPITAL_COMMUNITY): Payer: Managed Care, Other (non HMO)

## 2019-04-12 ENCOUNTER — Telehealth: Payer: Self-pay | Admitting: *Deleted

## 2019-04-12 ENCOUNTER — Encounter (HOSPITAL_COMMUNITY): Payer: Self-pay | Admitting: Gynecologic Oncology

## 2019-04-12 DIAGNOSIS — R160 Hepatomegaly, not elsewhere classified: Secondary | ICD-10-CM

## 2019-04-12 LAB — CBC
HCT: 38.6 % (ref 36.0–46.0)
Hemoglobin: 12 g/dL (ref 12.0–15.0)
MCH: 27.1 pg (ref 26.0–34.0)
MCHC: 31.1 g/dL (ref 30.0–36.0)
MCV: 87.1 fL (ref 80.0–100.0)
Platelets: 381 10*3/uL (ref 150–400)
RBC: 4.43 MIL/uL (ref 3.87–5.11)
RDW: 13 % (ref 11.5–15.5)
WBC: 18.8 10*3/uL — ABNORMAL HIGH (ref 4.0–10.5)
nRBC: 0 % (ref 0.0–0.2)

## 2019-04-12 LAB — COMPREHENSIVE METABOLIC PANEL
ALT: 57 U/L — ABNORMAL HIGH (ref 0–44)
AST: 147 U/L — ABNORMAL HIGH (ref 15–41)
Albumin: 2.8 g/dL — ABNORMAL LOW (ref 3.5–5.0)
Alkaline Phosphatase: 313 U/L — ABNORMAL HIGH (ref 38–126)
Anion gap: 9 (ref 5–15)
BUN: 27 mg/dL — ABNORMAL HIGH (ref 8–23)
CO2: 21 mmol/L — ABNORMAL LOW (ref 22–32)
Calcium: 11.9 mg/dL — ABNORMAL HIGH (ref 8.9–10.3)
Chloride: 105 mmol/L (ref 98–111)
Creatinine, Ser: 1.2 mg/dL — ABNORMAL HIGH (ref 0.44–1.00)
GFR calc Af Amer: 55 mL/min — ABNORMAL LOW (ref >60)
GFR calc non Af Amer: 47 mL/min — ABNORMAL LOW (ref >60)
Glucose, Bld: 75 mg/dL (ref 70–99)
Potassium: 4.8 mmol/L (ref 3.5–5.1)
Sodium: 135 mmol/L (ref 135–145)
Total Bilirubin: 1.4 mg/dL — ABNORMAL HIGH (ref 0.3–1.2)
Total Protein: 6.4 g/dL — ABNORMAL LOW (ref 6.5–8.1)

## 2019-04-12 LAB — HIV ANTIBODY (ROUTINE TESTING W REFLEX): HIV Screen 4th Generation wRfx: NONREACTIVE

## 2019-04-12 MED ORDER — SODIUM CHLORIDE 0.9 % IV SOLN
INTRAVENOUS | Status: DC
  Administered 2019-04-12 (×2): via INTRAVENOUS

## 2019-04-12 MED ORDER — ENOXAPARIN SODIUM 40 MG/0.4ML ~~LOC~~ SOLN
40.0000 mg | Freq: Every day | SUBCUTANEOUS | Status: DC
  Administered 2019-04-12 – 2019-04-20 (×9): 40 mg via SUBCUTANEOUS
  Filled 2019-04-12 (×9): qty 0.4

## 2019-04-12 MED ORDER — BOOST / RESOURCE BREEZE PO LIQD CUSTOM
1.0000 | Freq: Two times a day (BID) | ORAL | Status: DC
  Administered 2019-04-12 – 2019-04-15 (×3): 1 via ORAL

## 2019-04-12 MED ORDER — ADULT MULTIVITAMIN W/MINERALS CH
1.0000 | ORAL_TABLET | Freq: Every day | ORAL | Status: DC
  Administered 2019-04-14 – 2019-04-15 (×2): 1 via ORAL
  Filled 2019-04-12 (×8): qty 1

## 2019-04-12 MED ORDER — SODIUM CHLORIDE (PF) 0.9 % IJ SOLN
INTRAMUSCULAR | Status: AC
  Administered 2019-04-12: 19:00:00
  Filled 2019-04-12: qty 50

## 2019-04-12 MED ORDER — SODIUM CHLORIDE 0.9 % IV SOLN
INTRAVENOUS | Status: DC
  Administered 2019-04-12 – 2019-04-13 (×4): via INTRAVENOUS

## 2019-04-12 MED ORDER — ACETAMINOPHEN 500 MG PO TABS
500.0000 mg | ORAL_TABLET | Freq: Four times a day (QID) | ORAL | Status: DC | PRN
  Administered 2019-04-12 – 2019-04-14 (×5): 1000 mg via ORAL
  Administered 2019-04-15: 500 mg via ORAL
  Filled 2019-04-12: qty 2
  Filled 2019-04-12: qty 1
  Filled 2019-04-12 (×4): qty 2
  Filled 2019-04-12: qty 1
  Filled 2019-04-12: qty 2
  Filled 2019-04-12: qty 1

## 2019-04-12 MED ORDER — PRO-STAT SUGAR FREE PO LIQD
30.0000 mL | Freq: Every day | ORAL | Status: DC
  Filled 2019-04-12: qty 30

## 2019-04-12 MED ORDER — ATORVASTATIN CALCIUM 20 MG PO TABS: 20.0000 mg | ORAL_TABLET | Freq: Every day | ORAL | Status: DC

## 2019-04-12 MED ORDER — GADOBUTROL 1 MMOL/ML IV SOLN
10.0000 mL | Freq: Once | INTRAVENOUS | Status: AC | PRN
  Administered 2019-04-12: 10 mL via INTRAVENOUS

## 2019-04-12 MED ORDER — PROMETHAZINE HCL 25 MG PO TABS: 12.5000 mg | ORAL_TABLET | Freq: Three times a day (TID) | ORAL | Status: DC | PRN

## 2019-04-12 MED ORDER — ONDANSETRON HCL 4 MG/2ML IJ SOLN
4.0000 mg | Freq: Four times a day (QID) | INTRAMUSCULAR | Status: DC | PRN
  Administered 2019-04-12 – 2019-04-17 (×9): 4 mg via INTRAVENOUS
  Filled 2019-04-12 (×10): qty 2

## 2019-04-12 MED ORDER — ONDANSETRON HCL 4 MG PO TABS: 4.0000 mg | ORAL_TABLET | Freq: Four times a day (QID) | ORAL | Status: DC | PRN

## 2019-04-12 MED ORDER — SODIUM CHLORIDE 0.9% FLUSH
3.0000 mL | Freq: Two times a day (BID) | INTRAVENOUS | Status: DC
  Administered 2019-04-12 – 2019-04-19 (×10): 3 mL via INTRAVENOUS

## 2019-04-12 MED ORDER — IOHEXOL 300 MG/ML  SOLN
75.0000 mL | Freq: Once | INTRAMUSCULAR | Status: AC | PRN
  Administered 2019-04-12: 75 mL via INTRAVENOUS

## 2019-04-12 NOTE — Progress Notes (Signed)
Initial Nutrition Assessment  RD working remotely.   DOCUMENTATION CODES:   Morbid obesity  INTERVENTION:  - will order Boost Breeze BID, each supplement provides 250 kcal and 9 grams of protein. - will order 30 mL Prostat once/day, each supplement provides 100 kcal and 15 grams of protein. - will order daily multivitamin with minerals.  - diet advancement as medically feasible. - continue to encourage PO intakes as tolerated.    NUTRITION DIAGNOSIS:   Inadequate oral intake related to acute illness, nausea, vomiting, poor appetite as evidenced by per patient/family report.  GOAL:   Patient will meet greater than or equal to 90% of their needs  MONITOR:   PO intake, Supplement acceptance, Diet advancement, Labs, Weight trends, I & O's  REASON FOR ASSESSMENT:   Malnutrition Screening Tool  ASSESSMENT:   68 y.o. female with past medical history significant for complex ovarian cyst. She presented to the ED d/t worsening weakness and N/V which began 8 days PTA. She was seen at an outside ED last week where she was noted to have a mildly elevated calcium (11.5 mg/dl) and was treated with IV hydration and Zofran with temporary improvement.  Patient was discharged home on Zofran however the nausea has worsened. In the ED her Ca was 12.5 mg/dl. 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 Patient admitted for dehydration, hypercalcemia, and work-up of her likely ovarian malignancy.  Diet advanced from NPO to CLD today at 8:40 AM. Patient reports that for the past 1 week she has been experiencing constant nausea and vomits multiple times per day; episodes are not necessarily associated with anything. She reports she was prescribed zofran but that this did not help at all. She was unable to keep anything down during the past week but especially the past few days. Prior to the past week, her appetite was at baseline with no overt issues with eating or drinking.    Per chart review, current weight is 259 lb, weight on 5/28 was 256 lb, and weight on 3/17 was 269 lb. This indicates 10 lb weight loss (3.7% body weight) in the past 2.5 months.   Per notes: hypercalcemia, concern for ovarian cancer with lung and liver mets.   Medications reviewed. Labs reviewed; BUN: 27 mg/dl, creatinine: 1.2 mg/dl, Ca: 11.9 mg/dl, Alk Phos elevated, LFTs elevated, GFR: 47 ml/min. IVF; NS @ 200 ml/hr (started today at 1:00 PM).     NUTRITION - FOCUSED PHYSICAL EXAM:  unable to complete at this time.   Diet Order:   Diet Order            Diet clear liquid Room service appropriate? Yes; Fluid consistency: Thin  Diet effective now              EDUCATION NEEDS:   Not appropriate for education at this time  Skin:  Skin Assessment: Reviewed RN Assessment  Last BM:  6/2  Height:   Ht Readings from Last 1 Encounters:  04/12/19 _0  (1.626 m)    Weight:   Wt Readings from Last 1 Encounters:  04/12/19 117.3 kg    Ideal Body Weight:  54.5 kg  BMI:  Body mass index is 44.39 kg/m.  Estimated Nutritional Needs:   Kcal:  1880-2110 kcal  Protein:  95-110 grams  Fluid:  >/= 2.5 L/day      Jarome Matin, MS, RD, LDN, [facility] Inpatient Clinical Dietitian Pager # [phone] After hours/weekend pager # [phone]

## 2019-04-12 NOTE — Progress Notes (Signed)
PROGRESS NOTE    Sandra Richardson  EXN:[id number] DOB: 03-03-1951 DOA: 04/11/2019 PCP: McLean-Scocuzza, Nino Glow, MD     Brief Narrative:  Sandra Richardson is a 68 y.o. female with past medical history significant for complex ovarian cyst who was in her usual state of health until about 8 days ago when she started develop weakness, nausea and vomiting.  She was unable to tolerate much food because of anorexia and the nausea.  She was seen at an outside ED last week where she was noted to have a mildly elevated calcium at 11.5 and was treated with IV hydration and Zofran with temporary improvement.  Patient was discharged home on Zofran however the nausea has worsened and is not being able to be controlled on the Zofran alone.  Patient now comes in for management of weakness nausea and vomiting. She denies fevers or chills. She denies any blood in her vomitus.  Denies any abdominal pain or swelling.  Does admit to a 20 pound weight loss over the past couple of months which was unintentional.  The patient is noted to have an elevated calcium at 12.5 in the ED.  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  Patient is being admitted for dehydration, hypercalcemia and work-up of her likely ovarian malignancy.  New events last 24 hours / Subjective: Feeling slightly better, willing to try clear liquid diet although remains nauseous this morning with dry heaves.  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, liver  -Discussed with oncology NP, she has contacted gynecology oncology for consultation  Essential hypertension -Hold amlodipine-ACE for now   CKD -Baseline Cr 1    HLD -Hold  lipitor due to elevated LFTs    DVT prophylaxis: Lovenox Code Status: Full Family Communication: None  Disposition Plan: Pending further work up   Consultants:   Oncology  Procedures:   None   Antimicrobials:  Anti-infectives (From admission, onward)   None        Objective: Vitals:   04/11/19 2230 04/11/19 2300 04/12/19 0019 04/12/19 0602  BP: 119/67 138/60 (!) 159/64 (!) 156/55  Pulse: (!) 102 (!) 101 (!) 103 (!) 102  Resp: (!) 24 19 20 20   Temp:   97.9 F (36.6 C) 97.9 F (36.6 C)  TempSrc:   Oral Oral  SpO2: 93% 90% 96% 95%  Weight:   117.3 kg 117.3 kg  Height:   5\' 4"  (1.626 m)     Intake/Output Summary (Last 24 hours) at 04/12/2019 1252 Last data filed at 04/12/2019 0600 Gross per 24 hour  Intake 983.51 ml  Output 0 ml  Net 983.51 ml   Filed Weights   04/11/19 1543 04/12/19 0019 04/12/19 0602  Weight: 116.6 kg 117.3 kg 117.3 kg    Examination:  General exam: Appears calm and comfortable  Respiratory system: Clear to auscultation. Respiratory effort normal. Cardiovascular system: S1 & S2 heard, tachycardic rate, regular rhythm. No JVD, murmurs, rubs, gallops or clicks. No pedal edema. Gastrointestinal system: Abdomen is nondistended, soft and nontender. No organomegaly or masses felt. Normal bowel sounds heard. Central nervous system: Alert and oriented. No focal neurological deficits. Extremities: Symmetric  5 x 5 power. Skin: No rashes, lesions or ulcers Psychiatry: Judgement and insight appear normal. Mood & affect appropriate.   Data Reviewed: I have personally reviewed following labs and imaging studies  CBC: Recent Labs  Lab 04/06/19 1144 04/11/19 1621 04/12/19 0506  WBC 14.7* 16.9* 18.8*  HGB 14.1 13.7 12.0  HCT 43.3 43.2 38.6  MCV 84.9 86.2 87.1  PLT 458* 448* 102   Basic Metabolic Panel: Recent Labs  Lab 04/06/19 1144 04/11/19 1621 04/12/19 0506  NA 135 132* 135  K 4.5 4.6 4.8  CL 103 98 105  CO2 20* 20* 21*  GLUCOSE 126*  101* 75  BUN 21 26* 27*  CREATININE 1.13* 1.26* 1.20*  CALCIUM 11.3* 12.4* 11.9*   GFR: Estimated Creatinine Clearance: 58 mL/min (A) (by C-G formula based on SCr of 1.2 mg/dL (H)). Liver Function Tests: Recent Labs  Lab 04/06/19 1144 04/11/19 1621 04/12/19 0506  AST 71* 140* 147*  ALT 41 63* 57*  ALKPHOS 154* 326* 313*  BILITOT 1.0 1.0 1.4*  PROT 8.3* 7.6 6.4*  ALBUMIN 3.9 3.4* 2.8*   Recent Labs  Lab 04/06/19 1144 04/11/19 1621  LIPASE 41 39   No results for input(s): AMMONIA in the last 168 hours. Coagulation Profile: No results for input(s): INR, PROTIME in the last 168 hours. Cardiac Enzymes: Recent Labs  Lab 04/11/19 1621  TROPONINI 0.03*   BNP (last 3 results) No results for input(s): PROBNP in the last 8760 hours. HbA1C: No results for input(s): HGBA1C in the last 72 hours. CBG: No results for input(s): GLUCAP in the last 168 hours. Lipid Profile: No results for input(s): CHOL, HDL, LDLCALC, TRIG, CHOLHDL, LDLDIRECT in the last 72 hours. Thyroid Function Tests: No results for input(s): TSH, T4TOTAL, FREET4, T3FREE, THYROIDAB in the last 72 hours. Anemia Panel: No results for input(s): VITAMINB12, FOLATE, FERRITIN, TIBC, IRON, RETICCTPCT in the last 72 hours. Sepsis Labs: No results for input(s): PROCALCITON, LATICACIDVEN in the last 168 hours.  Recent Results (from the past 240 hour(s))  SARS Coronavirus 2 (CEPHEID - Performed in [place] hospital lab), Hosp Order     Status: None   Collection Time: 04/11/19  8:56 PM  Result Value Ref Range Status   SARS Coronavirus 2 NEGATIVE NEGATIVE Final    Comment: (NOTE) If result is NEGATIVE SARS-CoV-2 target nucleic acids are NOT DETECTED. The SARS-CoV-2 RNA is generally detectable in upper and lower  respiratory specimens during the acute phase of infection. The lowest  concentration of SARS-CoV-2 viral copies this assay can detect is 250  copies / mL. A negative result does not preclude SARS-CoV-2  infection  and should not be used as the sole basis for treatment or other  patient management decisions.  A negative result may occur with  improper specimen collection / handling, submission of specimen other  than nasopharyngeal swab, presence of viral mutation(s) within the  areas targeted by this assay, and inadequate number of viral copies  (<250 copies / mL). A negative result must be combined with clinical  observations, patient history, and epidemiological information. If result is POSITIVE SARS-CoV-2 target nucleic acids are DETECTED. The SARS-CoV-2 RNA is generally detectable in upper and lower  respiratory specimens dur ing the acute phase of infection.  Positive  results are indicative of active infection with SARS-CoV-2.  Clinical  correlation with patient history and other diagnostic information is  necessary to determine patient infection status.  Positive results do  not rule out bacterial infection or  co-infection with other viruses. If result is PRESUMPTIVE POSTIVE SARS-CoV-2 nucleic acids MAY BE PRESENT.   A presumptive positive result was obtained on the submitted specimen  and confirmed on repeat testing.  While 2019 novel coronavirus  (SARS-CoV-2) nucleic acids may be present in the submitted sample  additional confirmatory testing may be necessary for epidemiological  and / or clinical management purposes  to differentiate between  SARS-CoV-2 and other Sarbecovirus currently known to infect humans.  If clinically indicated additional testing with an alternate test  methodology [phone]) is advised. The SARS-CoV-2 RNA is generally  detectable in upper and lower respiratory sp ecimens during the acute  phase of infection. The expected result is Negative. Fact Sheet for Patients:  StrictlyIdeas.no Fact Sheet for Healthcare Providers: BankingDealers.co.za This test is not yet approved or cleared by the [place]  FDA and has been authorized for detection and/or diagnosis of SARS-CoV-2 by FDA under an Emergency Use Authorization (EUA).  This EUA will remain in effect (meaning this test can be used) for the duration of the COVID-19 declaration under Section 564(b)(1) of the Act, 21 U.S.C. section 360bbb-3(b)(1), unless the authorization is terminated or revoked sooner. Performed at [facility], [address] [place], [place] [address]        Radiology Studies: Ct Abdomen Pelvis W Contrast  Result Date: 04/11/2019 CLINICAL DATA:  Nausea and vomiting. EXAM: CT ABDOMEN AND PELVIS WITH CONTRAST TECHNIQUE: Multidetector CT imaging of the abdomen and pelvis was performed using the standard protocol following bolus administration of intravenous contrast. CONTRAST:  27mL OMNIPAQUE IOHEXOL 300 MG/ML  SOLN COMPARISON:  CT dated 01/04/2019. FINDINGS: Lower chest: There are innumerable pulmonary nodules involving the partially visualized lung bases. There is a trace right-sided pleural effusion. The heart size is mildly enlarged. Hepatobiliary: Multiple presumed liver cysts are again identified. There is a somewhat heterogeneous appearance of the liver parenchyma with findings suspicious for underlying metastatic lesions. These are not well appreciated on this exam secondary to contrast bolus timing. The liver size has increased from prior study currently measuring approximately 21.1 cm (previously measuring 15.3 cm). The gallbladder is unremarkable. Pancreas: Unremarkable. No pancreatic ductal dilatation or surrounding inflammatory changes. Spleen: Normal in size without focal abnormality. Adrenals/Urinary Tract: There is mild nodularity of the left adrenal gland which is stable from prior studies. There is no hydronephrosis. The right adrenal gland is unremarkable. The bladder is mostly decompressed which limits evaluation. Stomach/Bowel: There is severe sigmoid diverticulosis without CT evidence of diverticulitis. The  appendix is unremarkable. There is no small bowel obstruction. The stomach is unremarkable. Vascular/Lymphatic: There are few mildly prominent retroperitoneal lymph nodes measuring up to approximately 9 mm (axial series 2, image 46). Atherosclerotic changes are noted of the thoracic aorta and its branch vessels. Reproductive: There is a complex solid and cystic mass involving the right ovary currently measuring approximately 12.2 by 9.2 cm on the sagittal series (previously measuring approximately 10.6 x 8 cm.) Other: No abdominal wall hernia or abnormality. No abdominopelvic ascites. Musculoskeletal: No acute or significant osseous findings. IMPRESSION: 1. 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 There is a complex right ovarian mass which has increased in size from prior study dated 01/04/2019. In addition, there are innumerable new small pulmonary nodules bilaterally concerning for metastatic disease to the lungs. There is a heterogeneous appearance to the liver with interval increase in size from prior study, highly suspicious for underlying hepatic metastatic disease. 2. Severe sigmoid diverticulosis without CT evidence of diverticulitis.  Electronically Signed   By: Constance Holster M.D.   On: 04/11/2019 20:07   Dg Chest Portable 1 View  Result Date: 04/11/2019 CLINICAL DATA:  Vomiting. EXAM: PORTABLE CHEST 1 VIEW COMPARISON:  None. FINDINGS: The heart size is enlarged. The lung volumes are low. There are prominent interstitial lung markings bilaterally. There is likely atelectasis or scarring at the lung bases. No acute osseous abnormality. No pneumothorax. IMPRESSION: Mild cardiac enlargement with low lung volumes. Airspace opacity at the right lung base is favored to represent atelectasis. Electronically Signed   By: Constance Holster M.D.   On: 04/11/2019 19:11      Scheduled Meds: . atorvastatin  20 mg Oral q1800  . enoxaparin (LOVENOX) injection  40 mg  Subcutaneous QHS  . sodium chloride flush  3 mL Intravenous Once  . sodium chloride flush  3 mL Intravenous Q12H   Continuous Infusions: . sodium chloride 200 mL/hr at 04/12/19 0955     LOS: 1 day    Time spent: 40 minutes   Dessa Phi, DO [facility] [email or web address] 04/12/2019, 12:52 PM

## 2019-04-12 NOTE — Progress Notes (Signed)
Brief Medical Oncology Note:  Received consult. Chart reviewed with Dr. Julien Nordmann who is on call for Medical Oncology. Recommends GYN/ONC consult for biopsy of ovarian mass. I have placed the order for this consult. Please re consult Medical Oncology if needed once biopsy results have returned.   Dr. Maylene Roes notified of this information.  Mikey Bussing, DNP, AGPCNP-BC, AOCNP

## 2019-04-12 NOTE — Telephone Encounter (Signed)
Copied from [place] [id number]. Topic: General - Other >> Apr 11, 2019  3:58 PM Celene Kras A wrote: Reason for CRM: Pts niece called with most current BP reading of 103/72. Please advise.

## 2019-04-12 NOTE — Consult Note (Addendum)
Gynecologic Oncology Consultation  Sandra Richardson 68 y.o. female  CC:  Chief Complaint  Patient presents with  . Emesis    HPI: Sandra Richardson is a 68 year old female who presented to the Emergency Department on 04/11/2019 with nausea, vomiting, and weakness. She initially presented with gross hematuria and flank pain in February 2020 and underwent a CT renal stone study on 01/04/2019 that revealed a right complex cystic mass in the pelvis.  She was seen and evaluated by Dr. Benjaman Kindler, GYN at [facility], and was scheduled for surgical removal of the mass. The surgery was postponed due to COVID 19 and was considered non-essential per pt.  On 01/16/2019, her CA 125 was 31.5 and HE4 was elevated at 155.  Since that time, she reports continued weight loss and intermittent right lower quadrant discomfort.  8 days prior to admission, she began developing nausea, vomiting, and weakness. She was seen in the ED at [facility] on 04/06/2019 and was noted to have mildly elevated white blood cell count which was felt to be nonspecific, mild dehydration, and mild elevation of AST and alk phos. She was treated with IV fluid, IV zofran, and discharged home. The nausea persisted and she sought care at [facility] ED.  Review of Systems: Constitutional: Feels overall well.  Has had decreased appetite over the past several months. Denies early satiety. Unintentional weight loss of over 20 lbs over past several months. No fever or chills. Cardiovascular: No chest pain, shortness of breath, or edema.  Pulmonary: No cough or wheeze.  Gastrointestinal: Positive for diarrhea this am but reporting no significant change in bowel habits. No nausea, vomiting. No bright red blood per rectum or change in bowel movement. Reporting intermittent RLQ pain. Genitourinary: No frequency, urgency, or dysuria. Reports one episode of vaginal discharge/bleeding that resolved but no vaginal bleeding or discharge.  Musculoskeletal: No myalgia or  joint pain. Neurologic: No weakness, numbness, or change in gait.  Psychology: No depression, anxiety, or insomnia.  Health Maintenance: Mammogram: Up to date per pt, had last year Pap Smear: 08/2018 with HPV neg Colonoscopy: Due this year, had 5 yrs ago (no abnormal findings per pt) She has no children. She lives in a home and is able to care for herself independently.  Current Outpt Meds:  No current facility-administered medications on file prior to encounter.    Current Outpatient Medications on File Prior to Encounter  Medication Sig Dispense Refill  . acetaminophen (TYLENOL) 500 MG tablet Take 500-1,000 mg by mouth every 6 (six) hours as needed (pain.).    Marland Kitchen amLODipine-benazepril (LOTREL) 10-20 MG capsule Take 1 capsule by mouth daily. (Patient taking differently: Take 1 capsule by mouth daily. In the morning) 90 capsule 2  . atorvastatin (LIPITOR) 20 MG tablet Take 1 tablet (20 mg total) by mouth daily at 6 PM. 90 tablet 3  . Cholecalciferol (VITAMIN D-3) 5000 units TABS Take 5,000 Units by mouth daily.     . ondansetron (ZOFRAN ODT) 4 MG disintegrating tablet Take 1 tablet (4 mg total) by mouth every 8 (eight) hours as needed for nausea or vomiting. 40 tablet 0  . promethazine (PHENERGAN) 25 MG tablet Take 0.5-1 tablets (12.5-25 mg total) by mouth every 8 (eight) hours as needed for nausea or vomiting. 40 tablet 0    Allergy:  Allergies  Allergen Reactions  . Diclofenac Sodium Rash  . Seasonal Ic [Cholestatin]     Sinus drainage    Social Hx:   Social History  Socioeconomic History  . Marital status: Single    Spouse name: Not on file  . Number of children: Not on file  . Years of education: Not on file  . Highest education level: Not on file  Occupational History  . Occupation: Fish farm manager    Comment: tax office  Social Needs  . Financial resource strain: Not on file  . Food insecurity:    Worry: Not on file    Inability: Not on file  .  Transportation needs:    Medical: Not on file    Non-medical: Not on file  Tobacco Use  . Smoking status: Never Smoker  . Smokeless tobacco: Never Used  Substance and Sexual Activity  . Alcohol use: Not Currently    Alcohol/week: 0.0 standard drinks  . Drug use: Not Currently  . Sexual activity: Not Currently  Lifestyle  . Physical activity:    Days per week: Not on file    Minutes per session: Not on file  . Stress: Not on file  Relationships  . Social connections:    Talks on phone: Not on file    Gets together: Not on file    Attends religious service: Not on file    Active member of club or organization: Not on file    Attends meetings of clubs or organizations: Not on file    Relationship status: Not on file  . Intimate partner violence:    Fear of current or ex partner: Not on file    Emotionally abused: Not on file    Physically abused: Not on file    Forced sexual activity: Not on file  Other Topics Concern  . Not on file  Social History Narrative   No kids    Owns guns    Wears seat belts    Safe in relationship    College ed    Government job     Past Surgical Hx:  Past Surgical History:  Procedure Laterality Date  . ANKLE SURGERY Right    1987.  all metal removed  . GANGLION CYST EXCISION     70s/80s  . TOE SURGERY     R great toe late 1990s. wire in foot maybe  . TONSILLECTOMY AND ADENOIDECTOMY     1972    Past Medical Hx:  Past Medical History:  Diagnosis Date  . Allergy   . Chicken pox   . Complication of anesthesia   . History of kidney stones   . Hyperlipidemia   . Hypertension   . Liver cyst   . Ovarian cyst    1979  . PONV (postoperative nausea and vomiting)   . UTI (urinary tract infection)     Family Hx:  Family History  Problem Relation Age of Onset  . Heart disease Mother   . Hypertension Mother   . Hyperthyroidism Mother   . Osteoporosis Mother   . Heart disease Father   . COPD Father   . Kidney cancer Father   .  Arthritis Sister   . Depression Sister   . Hypertension Sister   . Hypothyroidism Sister   . Hyperthyroidism Brother        s/p ablation   . Stroke Maternal Grandmother   . Pneumonia Maternal Grandfather   . Cancer Paternal Grandmother        leukemia  . Heart disease Paternal Grandfather   . Cancer Brother        prostate/bladder cancer  . Breast cancer Neg  Hx    CBC    Component Value Date/Time   WBC 18.8 (H) 04/12/2019 0506   RBC 4.43 04/12/2019 0506   HGB 12.0 04/12/2019 0506   HGB 15.0 03/28/2018 0833   HCT 38.6 04/12/2019 0506   HCT 45.0 03/28/2018 0833   PLT 381 04/12/2019 0506   PLT 392 03/28/2018 0833   MCV 87.1 04/12/2019 0506   MCV 86 03/28/2018 0833   MCH 27.1 04/12/2019 0506   MCHC 31.1 04/12/2019 0506   RDW 13.0 04/12/2019 0506   RDW 13.2 03/28/2018 0833   LYMPHSABS 1.9 03/28/2018 0833   EOSABS 0.1 03/28/2018 0833   BASOSABS 0.0 03/28/2018 0833   CMP Latest Ref Rng & Units 04/12/2019 04/11/2019 04/06/2019  Glucose 70 - 99 mg/dL 75 101(H) 126(H)  BUN 8 - 23 mg/dL 27(H) 26(H) 21  Creatinine 0.44 - 1.00 mg/dL 1.20(H) 1.26(H) 1.13(H)  Sodium 135 - 145 mmol/L 135 132(L) 135  Potassium 3.5 - 5.1 mmol/L 4.8 4.6 4.5  Chloride 98 - 111 mmol/L 105 98 103  CO2 22 - 32 mmol/L 21(L) 20(L) 20(L)  Calcium 8.9 - 10.3 mg/dL 11.9(H) 12.4(H) 11.3(H)  Total Protein 6.5 - 8.1 g/dL 6.4(L) 7.6 8.3(H)  Total Bilirubin 0.3 - 1.2 mg/dL 1.4(H) 1.0 1.0  Alkaline Phos 38 - 126 U/L 313(H) 326(H) 154(H)  AST 15 - 41 U/L 147(H) 140(H) 71(H)  ALT 0 - 44 U/L 57(H) 63(H) 41   CT AP 04/11/2019 IMPRESSION: 1. 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 There is a complex right ovarian mass which has increased in size from prior study dated 01/04/2019. In addition, there are innumerable new small pulmonary nodules bilaterally concerning for metastatic disease to the lungs. There is a heterogeneous appearance to the liver with interval increase in size from prior study,  highly suspicious for underlying hepatic metastatic disease. 2. Severe sigmoid diverticulosis without CT evidence of diverticulitis.  Vitals:  Blood pressure (!) 156/55, pulse (!) 102, temperature 97.9 F (36.6 C), temperature source Oral, resp. rate 20, height _0  (1.626 m), weight 258 lb 9.6 oz (117.3 kg), SpO2 95 %.  Physical Exam:  General: Well developed, well nourished female in no acute distress. Alert and oriented x 3.  Cardiovascular: Regular rate and rhythm. S1 and S2 normal.  Lungs: Clear to auscultation bilaterally. No wheezes/crackles/rhonchi noted.  Skin: No rashes or lesions present. Ecchymosis present on left hand (from ED visit at [facility] per pt). Back: No CVA tenderness.  Abdomen: Abdomen soft, slightly tender with deep palpation of RLQ and morbidly obese. Active bowel sounds in all quadrants. Exam limited due to body habitus.  Genitourinary: to be performed by Dr. Denman George. Extremities: No bilateral cyanosis, edema, or clubbing.   Assessment/Plan: 68 year old with evidence of metastatic disease on CT imaging with complex right ovarian mass that has increased in size from prior study on 01/04/2019. Plan to check CA 125 and CEA with next lab draw. Plan to consult IR for possible liver biopsy to confirm diagnosis. Dr. Denman George to see patient later today. All questions answered with patient. Contact information given.   Dorothyann Gibbs, NP 04/12/2019, 3:07 PM   Update:  MR of the abdomen and CT chest with contrast requested by Dr. Pascal Lux with IR. Orders placed.

## 2019-04-13 ENCOUNTER — Inpatient Hospital Stay (HOSPITAL_COMMUNITY): Payer: Managed Care, Other (non HMO)

## 2019-04-13 LAB — CALCIUM, IONIZED: Calcium, Ionized, Serum: 7.2 mg/dL — ABNORMAL HIGH (ref 4.5–5.6)

## 2019-04-13 LAB — CBC
HCT: 38.4 % (ref 36.0–46.0)
Hemoglobin: 11.8 g/dL — ABNORMAL LOW (ref 12.0–15.0)
MCH: 27.3 pg (ref 26.0–34.0)
MCHC: 30.7 g/dL (ref 30.0–36.0)
MCV: 88.7 fL (ref 80.0–100.0)
Platelets: 359 10*3/uL (ref 150–400)
RBC: 4.33 MIL/uL (ref 3.87–5.11)
RDW: 13.2 % (ref 11.5–15.5)
WBC: 18 10*3/uL — ABNORMAL HIGH (ref 4.0–10.5)
nRBC: 0 % (ref 0.0–0.2)

## 2019-04-13 LAB — PTH, INTACT AND CALCIUM
Calcium, Total (PTH): 11.6 mg/dL — ABNORMAL HIGH (ref 8.7–10.3)
PTH: 51 pg/mL (ref 15–65)

## 2019-04-13 LAB — COMPREHENSIVE METABOLIC PANEL
ALT: 61 U/L — ABNORMAL HIGH (ref 0–44)
AST: 148 U/L — ABNORMAL HIGH (ref 15–41)
Albumin: 2.8 g/dL — ABNORMAL LOW (ref 3.5–5.0)
Alkaline Phosphatase: 302 U/L — ABNORMAL HIGH (ref 38–126)
Anion gap: 9 (ref 5–15)
BUN: 21 mg/dL (ref 8–23)
CO2: 18 mmol/L — ABNORMAL LOW (ref 22–32)
Calcium: 10.3 mg/dL (ref 8.9–10.3)
Chloride: 109 mmol/L (ref 98–111)
Creatinine, Ser: 0.95 mg/dL (ref 0.44–1.00)
GFR calc Af Amer: >60 mL/min (ref >60)
GFR calc non Af Amer: >60 mL/min (ref >60)
Glucose, Bld: 85 mg/dL (ref 70–99)
Potassium: 3.8 mmol/L (ref 3.5–5.1)
Sodium: 136 mmol/L (ref 135–145)
Total Bilirubin: 1.5 mg/dL — ABNORMAL HIGH (ref 0.3–1.2)
Total Protein: 6.3 g/dL — ABNORMAL LOW (ref 6.5–8.1)

## 2019-04-13 LAB — VITAMIN D 25 HYDROXY (VIT D DEFICIENCY, FRACTURES): Vit D, 25-Hydroxy: 54 ng/mL (ref 30.0–100.0)

## 2019-04-13 LAB — PROTIME-INR
INR: 1.3 — ABNORMAL HIGH (ref 0.8–1.2)
Prothrombin Time: 16.4 seconds — ABNORMAL HIGH (ref 11.4–15.2)

## 2019-04-13 MED ORDER — MIDAZOLAM HCL 2 MG/2ML IJ SOLN
INTRAMUSCULAR | Status: AC
  Filled 2019-04-13: qty 2

## 2019-04-13 MED ORDER — HYDRALAZINE HCL 20 MG/ML IJ SOLN: 5.0000 mg | INTRAMUSCULAR | Status: DC | PRN

## 2019-04-13 MED ORDER — MORPHINE SULFATE (PF) 2 MG/ML IV SOLN
1.0000 mg | INTRAVENOUS | Status: DC | PRN
  Administered 2019-04-15 – 2019-04-20 (×9): 1 mg via INTRAVENOUS
  Filled 2019-04-13 (×9): qty 1

## 2019-04-13 MED ORDER — LIDOCAINE HCL (PF) 1 % IJ SOLN
INTRAMUSCULAR | Status: AC | PRN
  Administered 2019-04-13: 10 mL

## 2019-04-13 MED ORDER — FENTANYL CITRATE (PF) 100 MCG/2ML IJ SOLN
INTRAMUSCULAR | Status: AC | PRN
  Administered 2019-04-13 (×2): 50 ug via INTRAVENOUS

## 2019-04-13 MED ORDER — FENTANYL CITRATE (PF) 100 MCG/2ML IJ SOLN
INTRAMUSCULAR | Status: AC
  Filled 2019-04-13: qty 2

## 2019-04-13 MED ORDER — AMLODIPINE BESYLATE 10 MG PO TABS
10.0000 mg | ORAL_TABLET | Freq: Every day | ORAL | Status: DC
  Administered 2019-04-13 – 2019-04-20 (×8): 10 mg via ORAL
  Filled 2019-04-13 (×8): qty 1

## 2019-04-13 MED ORDER — MIDAZOLAM HCL 2 MG/2ML IJ SOLN
INTRAMUSCULAR | Status: AC | PRN
  Administered 2019-04-13 (×2): 1 mg via INTRAVENOUS

## 2019-04-13 MED ORDER — GELATIN ABSORBABLE 12-7 MM EX MISC
CUTANEOUS | Status: AC
  Filled 2019-04-13: qty 1

## 2019-04-13 MED ORDER — PROMETHAZINE HCL 25 MG/ML IJ SOLN
12.5000 mg | Freq: Four times a day (QID) | INTRAMUSCULAR | Status: DC | PRN
  Administered 2019-04-13 – 2019-04-16 (×2): 12.5 mg via INTRAVENOUS
  Filled 2019-04-13 (×2): qty 1

## 2019-04-13 MED ORDER — LIDOCAINE HCL 1 % IJ SOLN
INTRAMUSCULAR | Status: AC
  Filled 2019-04-13: qty 20

## 2019-04-13 NOTE — Procedures (Signed)
Interventional Radiology Procedure Note  Procedure: US Guided Biopsy of Liver  Complications: None  Estimated Blood Loss: < 10 mL  Findings: 18 G core biopsy of liver performed under US guidance.  Three core samples obtained and sent to Pathology.  Corneisha Alvi T. Rikayla Demmon, M.D Pager:  [phone]    

## 2019-04-13 NOTE — Consult Note (Addendum)
Chief Complaint: Patient was seen in consultation today for image guided biopsy of liver lesion Chief Complaint  Patient presents with   Emesis    Referring Physician(s): Cross,M NP/ Rossi,E/Choi,J  Supervising Physician: Aletta Edouard  Patient Status: [facility] - In-pt  History of Present Illness: Sandra Richardson is a 68 y.o. female recently admitted to [facility] with  history of persistent nausea/ vomiting and weakness as well as abdominal bloating, weight loss, back pain, dyspnea and chest fullness.  She was COVID-19 negative.  Subsequent imaging has now revealed small to moderate right sized pleural effusion, scattered borderline mediastinal and hilar lymph nodes, diffuse pulmonary metastatic disease, and complex right ovarian mass which has increased in size since February of this year along with heterogeneous appearance of the liver with interval increase in size from prior studies highly suggestive for underlying hepatic metastatic disease. Also noted was severe sigmoid diverticulosis without diverticulitis.  Current CA 125 pending.  Request now received from GYN oncology for image guided liver lesion biopsy for further evaluation.  Past Medical History:  Diagnosis Date   Allergy    Chicken pox    Complication of anesthesia    History of kidney stones    Hyperlipidemia    Hypertension    Liver cyst    Ovarian cyst    1979   PONV (postoperative nausea and vomiting)    UTI (urinary tract infection)     Past Surgical History:  Procedure Laterality Date   ANKLE SURGERY Right    1987.  all metal removed   GANGLION CYST EXCISION     70s/80s   TOE SURGERY     R great toe late 1990s. wire in foot maybe   TONSILLECTOMY AND ADENOIDECTOMY     1972    Allergies: Diclofenac sodium and Seasonal ic [cholestatin]  Medications: Prior to Admission medications   Medication Sig Start Date End Date Taking? Authorizing Provider  acetaminophen (TYLENOL) 500  MG tablet Take 500-1,000 mg by mouth every 6 (six) hours as needed (pain.).   Yes [provider]  amLODipine-benazepril (LOTREL) 10-20 MG capsule Take 1 capsule by mouth daily. Patient taking differently: Take 1 capsule by mouth daily. In the morning 09/29/18  Yes McLean-Scocuzza, Nino Glow, MD  atorvastatin (LIPITOR) 20 MG tablet Take 1 tablet (20 mg total) by mouth daily at 6 PM. 03/28/19  Yes McLean-Scocuzza, Nino Glow, MD  Cholecalciferol (VITAMIN D-3) 5000 units TABS Take 5,000 Units by mouth daily.    Yes [provider]  ondansetron (ZOFRAN ODT) 4 MG disintegrating tablet Take 1 tablet (4 mg total) by mouth every 8 (eight) hours as needed for nausea or vomiting. 04/07/19  Yes McLean-Scocuzza, Nino Glow, MD  promethazine (PHENERGAN) 25 MG tablet Take 0.5-1 tablets (12.5-25 mg total) by mouth every 8 (eight) hours as needed for nausea or vomiting. 04/11/19   McLean-Scocuzza, Nino Glow, MD     Family History  Problem Relation Age of Onset   Heart disease Mother    Hypertension Mother    Hyperthyroidism Mother    Osteoporosis Mother    Heart disease Father    COPD Father    Kidney cancer Father    Arthritis Sister    Depression Sister    Hypertension Sister    Hypothyroidism Sister    Hyperthyroidism Brother        s/p ablation    Stroke Maternal Grandmother    Pneumonia Maternal Grandfather    Cancer Paternal Grandmother  leukemia   Heart disease Paternal Grandfather    Cancer Brother        prostate/bladder cancer   Breast cancer Neg Hx     Social History   Socioeconomic History   Marital status: Single    Spouse name: Not on Richardson   Number of children: Not on Richardson   Years of education: Not on Richardson   Highest education level: Not on Richardson  Occupational History   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  Social Needs   Financial resource strain: Not on Richardson   Food insecurity:    Worry: Not on Richardson    Inability:  Not on Richardson   Transportation needs:    Medical: Not on Richardson    Non-medical: Not on Richardson  Tobacco Use   Smoking status: Never Smoker   Smokeless tobacco: Never Used  Substance and Sexual Activity   Alcohol use: Not Currently    Alcohol/week: 0.0 standard drinks   Drug use: Not Currently   Sexual activity: Not Currently  Lifestyle   Physical activity:    Days per week: Not on Richardson    Minutes per session: Not on Richardson   Stress: Not on Richardson  Relationships   Social connections:    Talks on phone: Not on Richardson    Gets together: Not on Richardson    Attends religious service: Not on Richardson    Active member of club or organization: Not on Richardson    Attends meetings of clubs or organizations: Not on Richardson    Relationship status: Not on Richardson  Other Topics Concern   Not on Richardson  Social History Narrative   No kids    Owns guns    Wears seat belts    Safe in relationship    College ed    Government job       Review of Systems awake, alert.  Chest with diminished breath sounds bases.  Heart with regular rate and rhythm.  Abdomen obese, soft, positive bowel sounds, not significantly tender.  Extremities with full range of motion,  Vital Signs: BP (!) 172/60 (BP Location: Left Arm)    Pulse (!) 105    Temp 98.2 F (36.8 C) (Oral)    Resp 18    Ht 5\' 4"  (1.626 m)    Wt 266 lb 12.8 oz (121 kg)    LMP  (LMP Unknown)    SpO2 97%    BMI 45.80 kg/m   Physical Exam  Imaging: Ct Chest W Contrast  Result Date: 04/12/2019 CLINICAL DATA:  Pelvic mass seen on recent CT abdomen/pelvis with suspected hepatic metastatic disease. Evaluate the chest for metastatic disease. EXAM: CT CHEST WITH CONTRAST TECHNIQUE: Multidetector CT imaging of the chest was performed during intravenous contrast administration. CONTRAST:  30mL OMNIPAQUE IOHEXOL 300 MG/ML  SOLN COMPARISON:  CT abdomen/pelvis 04/11/2019 FINDINGS: Cardiovascular: The heart is normal in size. No pericardial effusion. The aorta is normal in  caliber. No dissection. Scattered atherosclerotic calcifications. The branch vessels are patent. Mediastinum/Nodes: Scattered mediastinal lymph nodes are noted. These are less than 8 mm. The esophagus is grossly normal. Lungs/Pleura: Numerous bilateral pulmonary nodules consistent with pulmonary metastatic disease. Index nodule in the left upper lobe on image number 56 of series 5 measures 6.5 mm. Left lower lobe pulmonary nodule on image number 94 measures 8.5 mm. 10 mm right lower lobe pulmonary nodule on image number 78. Small to moderate-sized right pleural effusions noted with significant  overlying atelectasis. There is also left basilar atelectasis. Upper Abdomen: Numerous benign-appearing hepatic cysts along with findings of hepatic metastatic disease, better demonstrated on the prior CT scan. Musculoskeletal: No significant bony findings. IMPRESSION: 1. Diffuse pulmonary metastatic disease. 2. Scattered borderline mediastinal and hilar lymph nodes. 3. Small to moderate-sized right pleural effusion with significant overlying right lower lobe atelectasis. Aortic Atherosclerosis (ICD10-I70.0). Electronically Signed   By: Marijo Sanes M.D.   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 EXAM: MRI ABDOMEN WITHOUT AND WITH CONTRAST TECHNIQUE: Multiplanar multisequence MR imaging of the abdomen was performed both before and after the administration of intravenous contrast. CONTRAST:  10 cc Gadavist IV. COMPARISON:  04/11/2019 CT abdomen/pelvis. FINDINGS: Lower chest: Small right and trace left dependent pleural effusions. Numerous pulmonary nodules are scattered at both lung bases. Hepatobiliary: Mild hepatomegaly. Background mild diffuse hepatic steatosis. The liver parenchyma is substantially replaced by patchy confluent enhancing  masses with signal intensity equal into the spleen, with discrete liver masses difficult to delineate. Representative segment 6 right liver lobe 4.5 x 2.5 cm mass (series 4/image 41), peripheral segment 8 right liver lobe 3.5 x 3.0 cm mass (series 4/image 23) and approximately 11.4 x 10.6 cm mass centered in segment 4A left liver lobe (series 4/image 26) with extension into segments 1, 2 and 8. Numerous simple liver cysts are scattered throughout the liver, largest 4.9 cm in the lateral segment left liver lobe. Normal gallbladder with no cholelithiasis. No biliary ductal dilatation. Common bile duct diameter 5 mm. No evidence of choledocholithiasis. Pancreas: No pancreatic mass or duct dilation.  No pancreas divisum. Spleen: Normal size. No mass. Adrenals/Urinary Tract: No discrete adrenal nodules. No hydronephrosis. Small simple bilateral renal cysts, largest 1.2 cm in the interpolar right kidney. No suspicious renal masses. Stomach/Bowel: Normal non-distended stomach. Visualized small and large bowel is normal caliber, with no bowel wall thickening. Vascular/Lymphatic: Normal caliber abdominal aorta. Patent portal, splenic, hepatic and renal veins. No pathologically enlarged lymph nodes in the abdomen. Other: No abdominal ascites or focal fluid collection. Partial visualization of heterogeneously enhancing right adnexal mass Musculoskeletal: No aggressive appearing focal osseous lesions. IMPRESSION: 1. Patchy confluent enhancing masses of stage Deatra Canter replaced the liver parenchyma, compatible with infiltrative hepatic metastatic disease, as detailed. 2. Numerous pulmonary nodules at the lung bases as better delineated on chest CT from earlier today, compatible with pulmonary metastatic disease. 3. Partial visualization of heterogeneously enhancing right adnexal mass, as described on CT abdomen/pelvis study from 1 day prior, suspicious for primary ovarian malignancy. 4. Small right and trace left dependent pleural  effusions. Electronically Signed   By: Ilona Sorrel M.D.   On: 04/13/2019 08:26   Ct Abdomen Pelvis W Contrast  Result Date: 04/11/2019 CLINICAL DATA:  Nausea and vomiting. EXAM: CT ABDOMEN AND PELVIS WITH CONTRAST TECHNIQUE: Multidetector CT imaging of the abdomen and pelvis was performed using the standard protocol following bolus administration of intravenous contrast. CONTRAST:  68mL OMNIPAQUE IOHEXOL 300 MG/ML  SOLN COMPARISON:  CT dated 01/04/2019. FINDINGS: Lower chest: There are innumerable pulmonary nodules involving the partially visualized lung bases. There is a trace right-sided pleural effusion. The heart size is mildly enlarged. Hepatobiliary: Multiple presumed liver cysts are again identified. There is a somewhat heterogeneous appearance of the liver parenchyma with findings suspicious for underlying metastatic lesions. These are not  well appreciated on this exam secondary to contrast bolus timing. The liver size has increased from prior study currently measuring approximately 21.1 cm (previously measuring 15.3 cm). The gallbladder is unremarkable. Pancreas: Unremarkable. No pancreatic ductal dilatation or surrounding inflammatory changes. Spleen: Normal in size without focal abnormality. Adrenals/Urinary Tract: There is mild nodularity of the left adrenal gland which is stable from prior studies. There is no hydronephrosis. The right adrenal gland is unremarkable. The bladder is mostly decompressed which limits evaluation. Stomach/Bowel: There is severe sigmoid diverticulosis without CT evidence of diverticulitis. The appendix is unremarkable. There is no small bowel obstruction. The stomach is unremarkable. Vascular/Lymphatic: There are few mildly prominent retroperitoneal lymph nodes measuring up to approximately 9 mm (axial series 2, image 46). Atherosclerotic changes are noted of the thoracic aorta and its branch vessels. Reproductive: There is a complex solid and cystic mass involving the  right ovary currently measuring approximately 12.2 by 9.2 cm on the sagittal series (previously measuring approximately 10.6 x 8 cm.) Other: No abdominal wall hernia or abnormality. No abdominopelvic ascites. Musculoskeletal: No acute or significant osseous findings. IMPRESSION: 1. 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 There is a complex right ovarian mass which has increased in size from prior study dated 01/04/2019. In addition, there are innumerable new small pulmonary nodules bilaterally concerning for metastatic disease to the lungs. There is a heterogeneous appearance to the liver with interval increase in size from prior study, highly suspicious for underlying hepatic metastatic disease. 2. Severe sigmoid diverticulosis without CT evidence of diverticulitis. Electronically Signed   By: Constance Holster M.D.   On: 04/11/2019 20:07   Dg Chest Portable 1 View  Result Date: 04/11/2019 CLINICAL DATA:  Vomiting. EXAM: PORTABLE CHEST 1 VIEW COMPARISON:  None. FINDINGS: The heart size is enlarged. The lung volumes are low. There are prominent interstitial lung markings bilaterally. There is likely atelectasis or scarring at the lung bases. No acute osseous abnormality. No pneumothorax. IMPRESSION: Mild cardiac enlargement with low lung volumes. Airspace opacity at the right lung base is favored to represent atelectasis. Electronically Signed   By: Constance Holster M.D.   On: 04/11/2019 19:11    Labs:  CBC: Recent Labs    04/06/19 1144 04/11/19 1621 04/12/19 0506 04/13/19 0534  WBC 14.7* 16.9* 18.8* 18.0*  HGB 14.1 13.7 12.0 11.8*  HCT 43.3 43.2 38.6 38.4  PLT 458* 448* 381 359    COAGS: Recent Labs    04/13/19 0957  INR 1.3*    BMP: Recent Labs    04/06/19 1144 04/11/19 1621 04/12/19 0506 04/12/19 1314 04/13/19 0534  NA 135 132* 135  --  136  K 4.5 4.6 4.8  --  3.8  CL 103 98 105  --  109  CO2 20* 20* 21*  --  18*  GLUCOSE 126* 101* 75  --   85  BUN 21 26* 27*  --  21  CALCIUM 11.3* 12.4* 11.9* 11.6* 10.3  CREATININE 1.13* 1.26* 1.20*  --  0.95  GFRNONAA 51* 44* 47*  --  >60  GFRAA 59* 51* 55*  --  >60    LIVER FUNCTION TESTS: Recent Labs    04/06/19 1144 04/11/19 1621 04/12/19 0506 04/13/19 0534  BILITOT 1.0 1.0 1.4* 1.5*  AST 71* 140* 147* 148*  ALT 41 63* 57* 61*  ALKPHOS 154* 326* 313* 302*  PROT 8.3* 7.6 6.4* 6.3*  ALBUMIN 3.9 3.4* 2.8* 2.8*    TUMOR MARKERS: No results for input(s):  AFPTM, CEA, CA199, CHROMGRNA in the last 8760 hours.  Assessment and Plan: 68 y.o. female recently admitted to [facility] with  history of persistent nausea/ vomiting and weakness as well as abdominal bloating, weight loss, back pain, dyspnea and chest fullness.  She was COVID-19 negative.  Subsequent imaging has now revealed small to moderate right sized pleural effusion, scattered borderline mediastinal and hilar lymph nodes, diffuse pulmonary metastatic disease, and complex right ovarian mass which has increased in size since February of this year along with heterogeneous appearance of the liver with interval increase in size from prior studies highly suggestive for underlying hepatic metastatic disease. Also noted was severe sigmoid diverticulosis without diverticulitis.  Current CA 125 pending.  Request now received from GYN oncology for image guided liver lesion biopsy for further evaluation.  Imaging studies have been reviewed by Dr. Kathlene Cote.Risks and benefits of procedure was discussed with the patient including, but not limited to bleeding, infection, damage to adjacent structures or low yield requiring additional tests.  All of the questions were answered and there is agreement to proceed.  Consent signed and in chart.  Procedure scheduled for today   Thank you for this interesting consult.  I greatly enjoyed meeting Sandra Richardson and look forward to participating in their care.  A copy of this report was sent to  the requesting provider on this date.  Electronically Signed: D. Rowe Robert, PA-C 04/13/2019, 2:28 PM   I spent a total of  20 minutes   in face to face in clinical consultation, greater than 50% of which was counseling/coordinating care for image guided liver lesion biopsy

## 2019-04-13 NOTE — Progress Notes (Signed)
Patient resting comfortably in the bed in no acute distress.  NPO for biopsy today. No concerns voiced. Introduced Sandra Richardson, GYN [place] navigator, and contact information given. Advised patient that we would continue to follow her progress and await the results of the biopsy. Advised her to reach out for any needs or questions.

## 2019-04-13 NOTE — Progress Notes (Signed)
PROGRESS NOTE    ALGA SOUTHALL  WCB:[id number] DOB: 03-02-1951 DOA: 04/11/2019 PCP: McLean-Scocuzza, Nino Glow, MD     Brief Narrative:  Sandra Richardson is a 68 y.o. female with past medical history significant for complex ovarian cyst who was in her usual state of health until about 8 days ago when she started develop weakness, nausea and vomiting.  She was unable to tolerate much food because of anorexia and the nausea.  She was seen at an outside ED last week where she was noted to have a mildly elevated calcium at 11.5 and was treated with IV hydration and Zofran with temporary improvement.  Patient was discharged home on Zofran however the nausea has worsened and is not being able to be controlled on the Zofran alone.  Patient now comes in for management of weakness nausea and vomiting. She denies fevers or chills. She denies any blood in her vomitus.  Denies any abdominal pain or swelling.  Does admit to a 20 pound weight loss over the past couple of months which was unintentional.  The patient is noted to have an elevated calcium at 12.5 in the ED.  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  Patient is being admitted for dehydration, hypercalcemia and work-up of her likely ovarian malignancy.  New events last 24 hours / Subjective: Not feeling well today, having dry heaves and lower back pain.  Also has dry cough that started yesterday.  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 Hold ACE due to AKI on presentation   AKI  -Baseline Cr 1  -Back to baseline    HLD -Hold lipitor due to elevated LFTs   Low back pain -Continue supportive care.  May need further imaging to rule out bony metastatic disease   DVT prophylaxis: Lovenox Code Status: Full Family Communication: Spoke with niece over the phone. Patient is unmarried and without biological children  Disposition Plan: Pending further work up   Consultants:   Oncology  Procedures:   None   Antimicrobials:  Anti-infectives (From admission, onward)   None       Objective: Vitals:   04/12/19 1330 04/12/19 2102 04/12/19 2319 04/13/19 0615  BP: 139/61 (!) 174/63 (!) 140/57 (!) 168/70  Pulse: 92 99 98 (!) 102  Resp: 19 18  16   Temp: 98.2 F (36.8 C) 97.6 F (36.4 C)  97.7 F (36.5 C)  TempSrc: Oral Oral  Oral  SpO2: 97% 93%  94%  Weight:    121 kg  Height:        Intake/Output Summary (Last 24 hours) at 04/13/2019 1043 Last data filed at 04/13/2019 0948 Gross per 24 hour  Intake 3474.34 ml  Output 1850 ml  Net 1624.34 ml   Filed Weights   04/12/19 0019 04/12/19 0602 04/13/19 0615  Weight: 117.3  kg 117.3 kg 121 kg    Examination: General exam: Appears calm but very weak and fatigued Respiratory system: Clear to auscultation. Respiratory effort normal.  No respiratory distress. Cardiovascular system: S1 & S2 heard, tachycardic, regular rhythm. No JVD, murmurs, rubs, gallops or clicks. No pedal edema. Gastrointestinal system: Abdomen is mildly distended, soft and nontender. No organomegaly or masses felt. Normal bowel sounds heard. Central nervous system: Alert and oriented. No focal neurological deficits. Extremities: Symmetric 5 x 5 power. Skin: No rashes, lesions or ulcers Psychiatry: Judgement and insight  appear normal. Mood & affect appropriate.    Data Reviewed: I have personally reviewed following labs and imaging studies  CBC: Recent Labs  Lab 04/06/19 1144 04/11/19 1621 04/12/19 0506 04/13/19 0534  WBC 14.7* 16.9* 18.8* 18.0*  HGB 14.1 13.7 12.0 11.8*  HCT 43.3 43.2 38.6 38.4  MCV 84.9 86.2 87.1 88.7  PLT 458* 448* 381 322   Basic Metabolic Panel: Recent Labs  Lab 04/06/19 1144 04/11/19 1621 04/12/19 0506 04/12/19 1314 04/13/19 0534  NA 135 132* 135  --  136  K 4.5 4.6 4.8  --  3.8  CL 103 98 105  --  109  CO2 20* 20* 21*  --  18*  GLUCOSE 126* 101* 75  --  85  BUN 21 26* 27*  --  21  CREATININE 1.13* 1.26* 1.20*  --  0.95  CALCIUM 11.3* 12.4* 11.9* 11.6* 10.3   GFR: Estimated Creatinine Clearance: 74.7 mL/min (by C-G formula based on SCr of 0.95 mg/dL). Liver Function Tests: Recent Labs  Lab 04/06/19 1144 04/11/19 1621 04/12/19 0506 04/13/19 0534  AST 71* 140* 147* 148*  ALT 41 63* 57* 61*  ALKPHOS 154* 326* 313* 302*  BILITOT 1.0 1.0 1.4* 1.5*  PROT 8.3* 7.6 6.4* 6.3*  ALBUMIN 3.9 3.4* 2.8* 2.8*   Recent Labs  Lab 04/06/19 1144 04/11/19 1621  LIPASE 41 39   No results for input(s): AMMONIA in the last 168 hours. Coagulation Profile: Recent Labs  Lab 04/13/19 0957  INR 1.3*   Cardiac Enzymes: Recent Labs  Lab 04/11/19 1621  TROPONINI 0.03*   BNP (last 3 results) No results for input(s): PROBNP in the last 8760 hours. HbA1C: No results for input(s): HGBA1C in the last 72 hours. CBG: No results for input(s): GLUCAP in the last 168 hours. Lipid Profile: No results for input(s): CHOL, HDL, LDLCALC, TRIG, CHOLHDL, LDLDIRECT in the last 72 hours. Thyroid Function Tests: No results for input(s): TSH, T4TOTAL, FREET4, T3FREE, THYROIDAB in the last 72 hours. Anemia Panel: No results for input(s): VITAMINB12, FOLATE, FERRITIN, TIBC, IRON, RETICCTPCT in the last 72 hours. Sepsis Labs: No results for input(s): PROCALCITON, LATICACIDVEN in  the last 168 hours.  Recent Results (from the past 240 hour(s))  SARS Coronavirus 2 (CEPHEID - Performed in [facility] hospital lab), Hosp Order     Status: None   Collection Time: 04/11/19  8:56 PM  Result Value Ref Range Status   SARS Coronavirus 2 NEGATIVE NEGATIVE Final    Comment: (NOTE) If result is NEGATIVE SARS-CoV-2 target nucleic acids are NOT DETECTED. The SARS-CoV-2 RNA is generally detectable in upper and lower  respiratory specimens during the acute phase of infection. The lowest  concentration of SARS-CoV-2 viral copies this assay can detect is 250  copies / mL. A negative result does not preclude SARS-CoV-2 infection  and should not be used as the sole basis for treatment or other  patient management decisions.  A negative  result may occur with  improper specimen collection / handling, submission of specimen other  than nasopharyngeal swab, presence of viral mutation(s) within the  areas targeted by this assay, and inadequate number of viral copies  (<250 copies / mL). A negative result must be combined with clinical  observations, patient history, and epidemiological information. If result is POSITIVE SARS-CoV-2 target nucleic acids are DETECTED. The SARS-CoV-2 RNA is generally detectable in upper and lower  respiratory specimens dur ing the acute phase of infection.  Positive  results are indicative of active infection with SARS-CoV-2.  Clinical  correlation with patient history and other diagnostic information is  necessary to determine patient infection status.  Positive results do  not rule out bacterial infection or co-infection with other viruses. If result is PRESUMPTIVE POSTIVE SARS-CoV-2 nucleic acids MAY BE PRESENT.   A presumptive positive result was obtained on the submitted specimen  and confirmed on repeat testing.  While 2019 novel coronavirus  (SARS-CoV-2) nucleic acids may be present in the submitted sample  additional confirmatory testing may be  necessary for epidemiological  and / or clinical management purposes  to differentiate between  SARS-CoV-2 and other Sarbecovirus currently known to infect humans.  If clinically indicated additional testing with an alternate test  methodology [phone]) is advised. The SARS-CoV-2 RNA is generally  detectable in upper and lower respiratory sp ecimens during the acute  phase of infection. The expected result is Negative. Fact Sheet for Patients:  StrictlyIdeas.no Fact Sheet for Healthcare Providers: BankingDealers.co.za This test is not yet approved or cleared by the [place] FDA and has been authorized for detection and/or diagnosis of SARS-CoV-2 by FDA under an Emergency Use Authorization (EUA).  This EUA will remain in effect (meaning this test can be used) for the duration of the COVID-19 declaration under Section 564(b)(1) of the Act, 21 U.S.C. section 360bbb-3(b)(1), unless the authorization is terminated or revoked sooner. Performed at [facility], [address], [place], [place] [address]        Radiology Studies: Ct Chest W Contrast  Result Date: 04/12/2019 CLINICAL DATA:  Pelvic mass seen on recent CT abdomen/pelvis with suspected hepatic metastatic disease. Evaluate the chest for metastatic disease. EXAM: CT CHEST WITH CONTRAST TECHNIQUE: Multidetector CT imaging of the chest was performed during intravenous contrast administration. CONTRAST:  46mL OMNIPAQUE IOHEXOL 300 MG/ML  SOLN COMPARISON:  CT abdomen/pelvis 04/11/2019 FINDINGS: Cardiovascular: The heart is normal in size. No pericardial effusion. The aorta is normal in caliber. No dissection. Scattered atherosclerotic calcifications. The branch vessels are patent. Mediastinum/Nodes: Scattered mediastinal lymph nodes are noted. These are less than 8 mm. The esophagus is grossly normal. Lungs/Pleura: Numerous bilateral pulmonary nodules consistent with pulmonary metastatic  disease. Index nodule in the left upper lobe on image number 56 of series 5 measures 6.5 mm. Left lower lobe pulmonary nodule on image number 94 measures 8.5 mm. 10 mm right lower lobe pulmonary nodule on image number 78. Small to moderate-sized right pleural effusions noted with significant overlying atelectasis. There is also left basilar atelectasis. Upper Abdomen: Numerous benign-appearing hepatic cysts along with findings of hepatic metastatic disease, better demonstrated on the prior CT scan. Musculoskeletal: No significant bony findings. IMPRESSION: 1. Diffuse pulmonary metastatic disease. 2. Scattered borderline mediastinal and hilar lymph nodes. 3. Small to moderate-sized right pleural effusion with significant overlying right lower lobe atelectasis. Aortic Atherosclerosis (ICD10-I70.0). Electronically Signed   By: Marijo Sanes M.D.   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 EXAM: MRI ABDOMEN WITHOUT AND WITH CONTRAST TECHNIQUE: Multiplanar multisequence MR imaging of the abdomen was performed both before and after the administration of intravenous contrast. CONTRAST:  10 cc Gadavist IV. COMPARISON:  04/11/2019 CT abdomen/pelvis. FINDINGS: Lower chest: Small right and trace left dependent pleural effusions. Numerous pulmonary nodules are scattered at both lung bases. Hepatobiliary: Mild hepatomegaly. Background mild diffuse hepatic steatosis. The liver parenchyma is substantially replaced by patchy confluent enhancing masses with signal intensity equal into the spleen, with discrete liver masses difficult to delineate. Representative segment 6 right liver lobe 4.5 x 2.5 cm mass (series 4/image 41), peripheral segment 8 right liver lobe 3.5 x 3.0 cm mass (series 4/image 23) and approximately 11.4 x 10.6 cm mass centered in  segment 4A left liver lobe (series 4/image 26) with extension into segments 1, 2 and 8. Numerous simple liver cysts are scattered throughout the liver, largest 4.9 cm in the lateral segment left liver lobe. Normal gallbladder with no cholelithiasis. No biliary ductal dilatation. Common bile duct diameter 5 mm. No evidence of choledocholithiasis. Pancreas: No pancreatic mass or duct dilation.  No pancreas divisum. Spleen: Normal size. No mass. Adrenals/Urinary Tract: No discrete adrenal nodules. No hydronephrosis. Small simple bilateral renal cysts, largest 1.2 cm in the interpolar right kidney. No suspicious renal masses. Stomach/Bowel: Normal non-distended stomach. Visualized small and large bowel is normal caliber, with no bowel wall thickening. Vascular/Lymphatic: Normal caliber abdominal aorta. Patent portal, splenic, hepatic and renal veins. No pathologically enlarged lymph nodes in the abdomen. Other: No abdominal ascites or focal fluid collection. Partial visualization of heterogeneously enhancing right adnexal mass Musculoskeletal: No aggressive appearing focal osseous lesions. IMPRESSION: 1. Patchy confluent enhancing masses of stage Deatra Canter replaced the liver parenchyma, compatible with infiltrative hepatic metastatic disease, as detailed. 2. Numerous pulmonary nodules at the lung bases as better delineated on chest CT from earlier today, compatible with pulmonary metastatic disease. 3. Partial visualization of heterogeneously enhancing right adnexal mass, as described on CT abdomen/pelvis study from 1 day prior, suspicious for primary ovarian malignancy. 4. Small right and trace left dependent pleural effusions. Electronically Signed   By: Ilona Sorrel M.D.   On: 04/13/2019 08:26   Ct Abdomen Pelvis W Contrast  Result Date: 04/11/2019 CLINICAL DATA:  Nausea and vomiting. EXAM: CT ABDOMEN AND PELVIS WITH CONTRAST TECHNIQUE: Multidetector CT imaging of the abdomen and pelvis was performed using the standard  protocol following bolus administration of intravenous contrast. CONTRAST:  52mL OMNIPAQUE IOHEXOL 300 MG/ML  SOLN COMPARISON:  CT dated 01/04/2019. FINDINGS: Lower chest: There are innumerable pulmonary nodules involving the partially visualized lung bases. There is a trace right-sided pleural effusion. The heart size is mildly enlarged. Hepatobiliary: Multiple presumed liver cysts are again identified. There is a somewhat heterogeneous appearance of the liver parenchyma with findings suspicious for underlying metastatic lesions. These are not well appreciated on this exam secondary to contrast bolus timing. The liver size has increased from prior study currently measuring approximately 21.1 cm (previously measuring 15.3 cm). The gallbladder is unremarkable. Pancreas: Unremarkable. No pancreatic ductal dilatation or surrounding inflammatory changes. Spleen: Normal in size without focal abnormality. Adrenals/Urinary Tract: There is mild nodularity of the left adrenal gland which is stable from prior studies. There is no hydronephrosis. The right adrenal gland is unremarkable. The bladder is mostly decompressed which limits evaluation. Stomach/Bowel: There is  severe sigmoid diverticulosis without CT evidence of diverticulitis. The appendix is unremarkable. There is no small bowel obstruction. The stomach is unremarkable. Vascular/Lymphatic: There are few mildly prominent retroperitoneal lymph nodes measuring up to approximately 9 mm (axial series 2, image 46). Atherosclerotic changes are noted of the thoracic aorta and its branch vessels. Reproductive: There is a complex solid and cystic mass involving the right ovary currently measuring approximately 12.2 by 9.2 cm on the sagittal series (previously measuring approximately 10.6 x 8 cm.) Other: No abdominal wall hernia or abnormality. No abdominopelvic ascites. Musculoskeletal: No acute or significant osseous findings. IMPRESSION: 1. Overall findings are highly  concerning for metastatic ovarian cancer. There is a complex right ovarian mass which has increased in size from prior study dated 01/04/2019. In addition, there are innumerable new small pulmonary nodules bilaterally concerning for metastatic disease to the lungs. There is a heterogeneous appearance to the liver with interval increase in size from prior study, highly suspicious for underlying hepatic metastatic disease. 2. Severe sigmoid diverticulosis without CT evidence of diverticulitis. Electronically Signed   By: Constance Holster M.D.   On: 04/11/2019 20:07   Dg Chest Portable 1 View  Result Date: 04/11/2019 CLINICAL DATA:  Vomiting. EXAM: PORTABLE CHEST 1 VIEW COMPARISON:  None. FINDINGS: The heart size is enlarged. The lung volumes are low. There are prominent interstitial lung markings bilaterally. There is likely atelectasis or scarring at the lung bases. No acute osseous abnormality. No pneumothorax. IMPRESSION: Mild cardiac enlargement with low lung volumes. Airspace opacity at the right lung base is favored to represent atelectasis. Electronically Signed   By: Constance Holster M.D.   On: 04/11/2019 19:11      Scheduled Meds:  amLODipine  10 mg Oral Daily   enoxaparin (LOVENOX) injection  40 mg Subcutaneous QHS   feeding supplement  1 Container Oral BID BM   feeding supplement (PRO-STAT SUGAR FREE 64)  30 mL Oral Daily   multivitamin with minerals  1 tablet Oral Daily   sodium chloride flush  3 mL Intravenous Q12H   Continuous Infusions:  sodium chloride 75 mL/hr at 04/13/19 0937     LOS: 2 days    Time spent: 40 minutes   Dessa Phi, DO [facility] [email or web address] 04/13/2019, 10:43 AM

## 2019-04-13 NOTE — Progress Notes (Addendum)
Pt wheezing and SOB, O2 sat 90 in RA. Pt states feel tire. Started O2 at 2 l. MD updated and orders received. SRP RN.

## 2019-04-13 NOTE — Progress Notes (Signed)
Pt states improvement and decrease SOB after O2 2 l sats 94%. SRP, RN

## 2019-04-14 LAB — COMPREHENSIVE METABOLIC PANEL
ALT: 74 U/L — ABNORMAL HIGH (ref 0–44)
AST: 194 U/L — ABNORMAL HIGH (ref 15–41)
Albumin: 2.9 g/dL — ABNORMAL LOW (ref 3.5–5.0)
Alkaline Phosphatase: 409 U/L — ABNORMAL HIGH (ref 38–126)
Anion gap: 10 (ref 5–15)
BUN: 21 mg/dL (ref 8–23)
CO2: 18 mmol/L — ABNORMAL LOW (ref 22–32)
Calcium: 9.9 mg/dL (ref 8.9–10.3)
Chloride: 110 mmol/L (ref 98–111)
Creatinine, Ser: 0.89 mg/dL (ref 0.44–1.00)
GFR calc Af Amer: >60 mL/min (ref >60)
GFR calc non Af Amer: >60 mL/min (ref >60)
Glucose, Bld: 82 mg/dL (ref 70–99)
Potassium: 3.9 mmol/L (ref 3.5–5.1)
Sodium: 138 mmol/L (ref 135–145)
Total Bilirubin: 1.8 mg/dL — ABNORMAL HIGH (ref 0.3–1.2)
Total Protein: 6.6 g/dL (ref 6.5–8.1)

## 2019-04-14 LAB — CBC
HCT: 38.3 % (ref 36.0–46.0)
Hemoglobin: 11.9 g/dL — ABNORMAL LOW (ref 12.0–15.0)
MCH: 27.4 pg (ref 26.0–34.0)
MCHC: 31.1 g/dL (ref 30.0–36.0)
MCV: 88.2 fL (ref 80.0–100.0)
Platelets: 384 10*3/uL (ref 150–400)
RBC: 4.34 MIL/uL (ref 3.87–5.11)
RDW: 13.6 % (ref 11.5–15.5)
WBC: 19.5 10*3/uL — ABNORMAL HIGH (ref 4.0–10.5)
nRBC: 0.1 % (ref 0.0–0.2)

## 2019-04-14 LAB — CA 125: Cancer Antigen (CA) 125: 142 U/mL — ABNORMAL HIGH (ref 0.0–38.1)

## 2019-04-14 LAB — CEA: CEA: 2 ng/mL (ref 0.0–4.7)

## 2019-04-14 MED ORDER — GUAIFENESIN ER 600 MG PO TB12
600.0000 mg | ORAL_TABLET | Freq: Two times a day (BID) | ORAL | Status: DC | PRN
  Administered 2019-04-14 – 2019-04-15 (×2): 600 mg via ORAL
  Filled 2019-04-14 (×2): qty 1

## 2019-04-14 MED ORDER — SENNOSIDES-DOCUSATE SODIUM 8.6-50 MG PO TABS: 1.0000 | ORAL_TABLET | Freq: Every evening | ORAL | Status: DC | PRN

## 2019-04-14 MED ORDER — TRAZODONE HCL 50 MG PO TABS
50.0000 mg | ORAL_TABLET | Freq: Once | ORAL | Status: AC
  Administered 2019-04-14: 50 mg via ORAL
  Filled 2019-04-14: qty 1

## 2019-04-14 MED ORDER — FLUTICASONE PROPIONATE 50 MCG/ACT NA SUSP
2.0000 | NASAL | Status: DC | PRN
  Filled 2019-04-14: qty 16

## 2019-04-14 MED ORDER — GLYCERIN (LAXATIVE) 2.1 G RE SUPP
1.0000 | Freq: Every day | RECTAL | Status: DC | PRN
  Filled 2019-04-14: qty 1

## 2019-04-14 NOTE — Progress Notes (Addendum)
PROGRESS NOTE    Sandra Richardson  ATF:[id number] DOB: 12-21-1950 DOA: 04/11/2019 PCP: McLean-Scocuzza, Nino Glow, MD     Brief Narrative:  Sandra Richardson is a 68 y.o. female with past medical history significant for complex ovarian cyst who was in her usual state of health until about 8 days ago when she started develop weakness, nausea and vomiting.  She was unable to tolerate much food because of anorexia and the nausea.  She was seen at an outside ED last week where she was noted to have a mildly elevated calcium at 11.5 and was treated with IV hydration and Zofran with temporary improvement.  Patient was discharged home on Zofran however the nausea has worsened and is not being able to be controlled on the Zofran alone.  Patient now comes in for management of weakness nausea and vomiting. She denies fevers or chills. She denies any blood in her vomitus.  Denies any abdominal pain or swelling.  Does admit to a 20 pound weight loss over the past couple of months which was unintentional.  The patient is noted to have an elevated calcium at 12.5 in the ED.  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  Patient is being admitted for dehydration, hypercalcemia and work-up of her likely ovarian malignancy.  New events last 24 hours / Subjective: Breathing has improved this morning, ate some grits and eggs, but still nauseous.   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 Hold ACE due to AKI on presentation   AKI  -Baseline Cr 1  -Back to baseline    HLD -Hold lipitor due to elevated LFTs   Low back pain -Continue supportive care.  May need further imaging to rule out bony metastatic disease  Acute hypoxemic respiratory failure -Currently on 2L [place] O2, CXR independently review which reveals small right pleural effusion without over pulmonary edema. IVF dc'd yesterday.    DVT prophylaxis: Lovenox Code Status: Full Family Communication: Spoke with niece over the phone today. Patient is unmarried and without biological children  Disposition Plan: Pending further work up, pathology report, improvement in nutrition and ambulation    Consultants:   Oncology  Procedures:   None   Antimicrobials:  Anti-infectives (From admission, onward)   None       Objective: Vitals:   04/13/19 2129 04/14/19 0201 04/14/19 0530 04/14/19 0532  BP: (!) 143/60 (!) 142/67  (!) 144/63  Pulse: (!) 104 (!) 104  (!) 101  Resp: (!) 24 (!) 21  20  Temp: 98.1 F (36.7 C) 98.2 F (36.8 C)  97.7 F (36.5 C)  TempSrc: Oral Oral  Oral  SpO2: 95% 95%  94%  Weight:   122.2 kg   Height:        Intake/Output Summary (Last 24 hours) at 04/14/2019 1124 Last data filed at 04/14/2019 1008 Gross per  24 hour  Intake 360 ml  Output 1700 ml  Net -1340 ml   Filed Weights   04/12/19 0602 04/13/19 0615 04/14/19 0530  Weight: 117.3 kg 121 kg 122.2 kg   Examination: General exam: Appears calm and comfortable  Respiratory system: Diminished right lower base  Cardiovascular system: S1 & S2 heard, tachycardic, regular rhythm. No JVD, murmurs, rubs, gallops or clicks. No pedal edema. Gastrointestinal system: Abdomen is nondistended, soft and nontender. No organomegaly or masses felt. Normal bowel sounds heard. Central nervous system: Alert and oriented.  No focal neurological deficits. Extremities: Symmetric 5 x 5 power. Skin: No rashes, lesions or ulcers Psychiatry: Judgement and insight appear normal. Mood & affect appropriate.    Data Reviewed: I have personally reviewed following labs and imaging studies  CBC: Recent Labs  Lab 04/11/19 1621 04/12/19 0506 04/13/19 0534 04/14/19 0513  WBC 16.9* 18.8* 18.0* 19.5*  HGB 13.7 12.0 11.8* 11.9*  HCT 43.2 38.6 38.4 38.3  MCV 86.2 87.1 88.7 88.2  PLT 448* 381 359 176   Basic Metabolic Panel: Recent Labs  Lab 04/11/19 1621 04/12/19 0506 04/12/19 1314 04/13/19 0534 04/14/19 0513  NA 132* 135  --  136 138  K 4.6 4.8  --  3.8 3.9  CL 98 105  --  109 110  CO2 20* 21*  --  18* 18*  GLUCOSE 101* 75  --  85 82  BUN 26* 27*  --  21 21  CREATININE 1.26* 1.20*  --  0.95 0.89  CALCIUM 12.4* 11.9* 11.6* 10.3 9.9   GFR: Estimated Creatinine Clearance: 80.2 mL/min (by C-G formula based on SCr of 0.89 mg/dL). Liver Function Tests: Recent Labs  Lab 04/11/19 1621 04/12/19 0506 04/13/19 0534 04/14/19 0513  AST 140* 147* 148* 194*  ALT 63* 57* 61* 74*  ALKPHOS 326* 313* 302* 409*  BILITOT 1.0 1.4* 1.5* 1.8*  PROT 7.6 6.4* 6.3* 6.6  ALBUMIN 3.4* 2.8* 2.8* 2.9*   Recent Labs  Lab 04/11/19 1621  LIPASE 39   No results for input(s): AMMONIA in the last 168 hours. Coagulation Profile: Recent Labs  Lab 04/13/19 0957  INR 1.3*   Cardiac Enzymes: Recent Labs  Lab 04/11/19 1621  TROPONINI 0.03*   BNP (last 3 results) No results for input(s): PROBNP in the last 8760 hours. HbA1C: No results for input(s): HGBA1C in the last 72 hours. CBG: No results for input(s): GLUCAP in the last 168 hours. Lipid Profile: No results for input(s): CHOL, HDL, LDLCALC, TRIG, CHOLHDL, LDLDIRECT in the last 72 hours. Thyroid Function Tests: No results for input(s): TSH, T4TOTAL, FREET4, T3FREE, THYROIDAB in the last 72 hours. Anemia Panel: No results for input(s): VITAMINB12, FOLATE,  FERRITIN, TIBC, IRON, RETICCTPCT in the last 72 hours. Sepsis Labs: No results for input(s): PROCALCITON, LATICACIDVEN in the last 168 hours.  Recent Results (from the past 240 hour(s))  SARS Coronavirus 2 (CEPHEID - Performed in [place] hospital lab), Hosp Order     Status: None   Collection Time: 04/11/19  8:56 PM  Result Value Ref Range Status   SARS Coronavirus 2 NEGATIVE NEGATIVE Final    Comment: (NOTE) If result is NEGATIVE SARS-CoV-2 target nucleic acids are NOT DETECTED. The SARS-CoV-2 RNA is generally detectable in upper and lower  respiratory specimens during the acute phase of infection. The lowest  concentration of SARS-CoV-2 viral copies this assay can detect is 250  copies / mL. A negative result does not preclude SARS-CoV-2 infection  and should not  be used as the sole basis for treatment or other  patient management decisions.  A negative result may occur with  improper specimen collection / handling, submission of specimen other  than nasopharyngeal swab, presence of viral mutation(s) within the  areas targeted by this assay, and inadequate number of viral copies  (<250 copies / mL). A negative result must be combined with clinical  observations, patient history, and epidemiological information. If result is POSITIVE SARS-CoV-2 target nucleic acids are DETECTED. The SARS-CoV-2 RNA is generally detectable in upper and lower  respiratory specimens dur ing the acute phase of infection.  Positive  results are indicative of active infection with SARS-CoV-2.  Clinical  correlation with patient history and other diagnostic information is  necessary to determine patient infection status.  Positive results do  not rule out bacterial infection or co-infection with other viruses. If result is PRESUMPTIVE POSTIVE SARS-CoV-2 nucleic acids MAY BE PRESENT.   A presumptive positive result was obtained on the submitted specimen  and confirmed on repeat testing.  While 2019  novel coronavirus  (SARS-CoV-2) nucleic acids may be present in the submitted sample  additional confirmatory testing may be necessary for epidemiological  and / or clinical management purposes  to differentiate between  SARS-CoV-2 and other Sarbecovirus currently known to infect humans.  If clinically indicated additional testing with an alternate test  methodology [phone]) is advised. The SARS-CoV-2 RNA is generally  detectable in upper and lower respiratory sp ecimens during the acute  phase of infection. The expected result is Negative. Fact Sheet for Patients:  StrictlyIdeas.no Fact Sheet for Healthcare Providers: BankingDealers.co.za This test is not yet approved or cleared by the [place] FDA and has been authorized for detection and/or diagnosis of SARS-CoV-2 by FDA under an Emergency Use Authorization (EUA).  This EUA will remain in effect (meaning this test can be used) for the duration of the COVID-19 declaration under Section 564(b)(1) of the Act, 21 U.S.C. section 360bbb-3(b)(1), unless the authorization is terminated or revoked sooner. Performed at [facility], [address], [place], [place] [address]        Radiology Studies: Ct Chest W Contrast  Result Date: 04/12/2019 CLINICAL DATA:  Pelvic mass seen on recent CT abdomen/pelvis with suspected hepatic metastatic disease. Evaluate the chest for metastatic disease. EXAM: CT CHEST WITH CONTRAST TECHNIQUE: Multidetector CT imaging of the chest was performed during intravenous contrast administration. CONTRAST:  3mL OMNIPAQUE IOHEXOL 300 MG/ML  SOLN COMPARISON:  CT abdomen/pelvis 04/11/2019 FINDINGS: Cardiovascular: The heart is normal in size. No pericardial effusion. The aorta is normal in caliber. No dissection. Scattered atherosclerotic calcifications. The branch vessels are patent. Mediastinum/Nodes: Scattered mediastinal lymph nodes are noted. These are less than 8  mm. The esophagus is grossly normal. Lungs/Pleura: Numerous bilateral pulmonary nodules consistent with pulmonary metastatic disease. Index nodule in the left upper lobe on image number 56 of series 5 measures 6.5 mm. Left lower lobe pulmonary nodule on image number 94 measures 8.5 mm. 10 mm right lower lobe pulmonary nodule on image number 78. Small to moderate-sized right pleural effusions noted with significant overlying atelectasis. There is also left basilar atelectasis. Upper Abdomen: Numerous benign-appearing hepatic cysts along with findings of hepatic metastatic disease, better demonstrated on the prior CT scan. Musculoskeletal: No significant bony findings. IMPRESSION: 1. Diffuse pulmonary metastatic disease. 2. Scattered borderline mediastinal and hilar lymph nodes. 3. Small to moderate-sized right pleural effusion with significant overlying right lower lobe atelectasis. Aortic Atherosclerosis (ICD10-I70.0). Electronically Signed  By: Marijo Sanes M.D.   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 EXAM: MRI ABDOMEN WITHOUT AND WITH CONTRAST TECHNIQUE: Multiplanar multisequence MR imaging of the abdomen was performed both before and after the administration of intravenous contrast. CONTRAST:  10 cc Gadavist IV. COMPARISON:  04/11/2019 CT abdomen/pelvis. FINDINGS: Lower chest: Small right and trace left dependent pleural effusions. Numerous pulmonary nodules are scattered at both lung bases. Hepatobiliary: Mild hepatomegaly. Background mild diffuse hepatic steatosis. The liver parenchyma is substantially replaced by patchy confluent enhancing masses with signal intensity equal into the spleen, with discrete liver masses difficult to delineate. Representative segment 6 right liver lobe 4.5 x 2.5 cm mass (series 4/image 41),  peripheral segment 8 right liver lobe 3.5 x 3.0 cm mass (series 4/image 23) and approximately 11.4 x 10.6 cm mass centered in segment 4A left liver lobe (series 4/image 26) with extension into segments 1, 2 and 8. Numerous simple liver cysts are scattered throughout the liver, largest 4.9 cm in the lateral segment left liver lobe. Normal gallbladder with no cholelithiasis. No biliary ductal dilatation. Common bile duct diameter 5 mm. No evidence of choledocholithiasis. Pancreas: No pancreatic mass or duct dilation.  No pancreas divisum. Spleen: Normal size. No mass. Adrenals/Urinary Tract: No discrete adrenal nodules. No hydronephrosis. Small simple bilateral renal cysts, largest 1.2 cm in the interpolar right kidney. No suspicious renal masses. Stomach/Bowel: Normal non-distended stomach. Visualized small and large bowel is normal caliber, with no bowel wall thickening. Vascular/Lymphatic: Normal caliber abdominal aorta. Patent portal, splenic, hepatic and renal veins. No pathologically enlarged lymph nodes in the abdomen. Other: No abdominal ascites or focal fluid collection. Partial visualization of heterogeneously enhancing right adnexal mass Musculoskeletal: No aggressive appearing focal osseous lesions. IMPRESSION: 1. Patchy confluent enhancing masses of stage Deatra Canter replaced the liver parenchyma, compatible with infiltrative hepatic metastatic disease, as detailed. 2. Numerous pulmonary nodules at the lung bases as better delineated on chest CT from earlier today, compatible with pulmonary metastatic disease. 3. Partial visualization of heterogeneously enhancing right adnexal mass, as described on CT abdomen/pelvis study from 1 day prior, suspicious for primary ovarian malignancy. 4. Small right and trace left dependent pleural effusions. Electronically Signed   By: Ilona Sorrel M.D.   On: 04/13/2019 08:26   US Biopsy (liver)  Result Date: 04/13/2019 INDICATION: Right adnexal mass, multiple pulmonary nodules  and diffuse ill-defined masses in the liver. The patient presents for liver biopsy. EXAM: ULTRASOUND GUIDED CORE BIOPSY OF LIVER MEDICATIONS: None. ANESTHESIA/SEDATION: Fentanyl 100 mcg IV; Versed 2.0 mg IV Moderate Sedation Time:  16 minutes. The patient was continuously monitored during the procedure by the interventional radiology nurse under my direct supervision. PROCEDURE: The procedure, risks, benefits, and alternatives were explained to the patient. Questions regarding the procedure were encouraged and answered. The patient understands and consents to the procedure. Ultrasound was performed of the liver. The anterior abdominal wall was prepped with chlorhexidine in a sterile fashion, and a sterile drape was applied covering the operative field. A sterile gown and sterile gloves were used for the procedure. Local anesthesia was provided with 1% Lidocaine. A 17 gauge trocar needle was advanced into the left lobe of the liver under ultrasound guidance. After confirming needle tip position, coaxial 18 gauge core biopsy samples were obtained. Three core biopsy samples were submitted in formalin.  Gel-Foam pledgets were advanced through the outer needle as the needle was retracted. COMPLICATIONS: None immediate. FINDINGS: Lesions in the liver are very ill-defined by ultrasound and not discrete. An area of heterogeneous parenchyma in the left lobe was targeted. Solid tissue was obtained. IMPRESSION: Ultrasound-guided core biopsy performed in the left lobe of the liver. Electronically Signed   By: Aletta Edouard M.D.   On: 04/13/2019 17:17   Dg Chest Port 1 View  Result Date: 04/13/2019 CLINICAL DATA:  Status post right thoracentesis today. EXAM: PORTABLE CHEST 1 VIEW COMPARISON:  CT chest 04/12/2019. Single-view of the chest 04/11/2019. FINDINGS: Small right pleural effusion is seen. No pneumothorax. No left effusion. Heart size is normal. Innumerable small bilateral pulmonary nodules are noted. IMPRESSION:  Negative for pneumothorax after thoracentesis. Innumerable bilateral pulmonary nodules. Electronically Signed   By: Inge Rise M.D.   On: 04/13/2019 16:35      Scheduled Meds:  amLODipine  10 mg Oral Daily   enoxaparin (LOVENOX) injection  40 mg Subcutaneous QHS   feeding supplement  1 Container Oral BID BM   feeding supplement (PRO-STAT SUGAR FREE 64)  30 mL Oral Daily   multivitamin with minerals  1 tablet Oral Daily   sodium chloride flush  3 mL Intravenous Q12H   Continuous Infusions:    LOS: 3 days    Time spent: 25 minutes   Dessa Phi, DO [facility] [email or web address] 04/14/2019, 11:24 AM

## 2019-04-15 LAB — CBC
HCT: 34.6 % — ABNORMAL LOW (ref 36.0–46.0)
Hemoglobin: 10.9 g/dL — ABNORMAL LOW (ref 12.0–15.0)
MCH: 27.5 pg (ref 26.0–34.0)
MCHC: 31.5 g/dL (ref 30.0–36.0)
MCV: 87.2 fL (ref 80.0–100.0)
Platelets: 356 10*3/uL (ref 150–400)
RBC: 3.97 MIL/uL (ref 3.87–5.11)
RDW: 14 % (ref 11.5–15.5)
WBC: 19 10*3/uL — ABNORMAL HIGH (ref 4.0–10.5)
nRBC: 0.2 % (ref 0.0–0.2)

## 2019-04-15 LAB — COMPREHENSIVE METABOLIC PANEL
ALT: 95 U/L — ABNORMAL HIGH (ref 0–44)
AST: 253 U/L — ABNORMAL HIGH (ref 15–41)
Albumin: 2.9 g/dL — ABNORMAL LOW (ref 3.5–5.0)
Alkaline Phosphatase: 651 U/L — ABNORMAL HIGH (ref 38–126)
Anion gap: 9 (ref 5–15)
BUN: 27 mg/dL — ABNORMAL HIGH (ref 8–23)
CO2: 19 mmol/L — ABNORMAL LOW (ref 22–32)
Calcium: 9.4 mg/dL (ref 8.9–10.3)
Chloride: 107 mmol/L (ref 98–111)
Creatinine, Ser: 1.02 mg/dL — ABNORMAL HIGH (ref 0.44–1.00)
GFR calc Af Amer: >60 mL/min (ref >60)
GFR calc non Af Amer: 57 mL/min — ABNORMAL LOW (ref >60)
Glucose, Bld: 117 mg/dL — ABNORMAL HIGH (ref 70–99)
Potassium: 3.8 mmol/L (ref 3.5–5.1)
Sodium: 135 mmol/L (ref 135–145)
Total Bilirubin: 2 mg/dL — ABNORMAL HIGH (ref 0.3–1.2)
Total Protein: 6.5 g/dL (ref 6.5–8.1)

## 2019-04-15 NOTE — Progress Notes (Signed)
PT Cancellation Note  Patient Details Name: Sandra Richardson MRN: [id number] DOB: 22-Jul-1951   Cancelled Treatment:    Reason Eval/Treat Not Completed: Patient declined, no reason specified  Therapist returned to attempt eval with patient. She reported pain or 3-5/10 in her abdomen along her Rt side and superiorly. She stated she did not want to sit up or get up to walk and work with therapy. Therapist explained importance of mobilizing to improve overall health and gut health. Educated that mobility and exercise can often reduce pain. Patient asked that we come back tomorrow with work with her. Will attempt eval tomorrow per pt request.  Kipp Brood, PT, DPT, [facility] Physical Therapist with [facility]  04/15/2019 3:49 PM

## 2019-04-15 NOTE — Evaluation (Addendum)
Occupational Therapy Evaluation Patient Details Name: Sandra Richardson MRN: [id number] DOB: [birth year] Today's Date: 04/15/2019    History of Present Illness Sandra Richardson is an 68 y.o. female with past medical history significant for complex ovarian cyst who was in her usual state of health until about 8 days ago when she started develop weakness, nausea and vomiting. 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  Patient is being admitted for dehydration, hypercalcemia and work-up of her likely ovarian malignancy   Clinical Impression   Pt admitted with above diagnoses, cardiopulmonary status and generalized weakness limiting ability to engage in BADL at desired level of ind. At time of eval, pt on 2L [place] (does not wear at baseline). She c/o shortness of breath with ADL mobility. PTA pt was living independently, she shares up until 8 days ago she was not needing assist, but since then her niece has been staying with her. Her niece had been helping to manage IADL tasks, pt baseline ind in BADL. Began to discuss energy conservation strategies with pt in relation to ADL tasks, pt receptive. She will benefit from acute OT to regain PLOF and ECS strategies with BADL. At this time recommend HHOT vs no f/u pending pt progress. She may progress without needing additional support at home. Will follow per POC.    Follow Up Recommendations  Home health OT;No OT follow up;Other (comment)(vs pending hospital progress)    Equipment Recommendations  Other (comment)(TBD pending progress)    Recommendations for Other Services       Precautions / Restrictions Precautions Precautions: Fall Restrictions Weight Bearing Restrictions: No      Mobility Bed Mobility Overal bed mobility: Modified Independent                Transfers                 General transfer comment: pt deferred due to nausea and fatigue at this time despite encouragement and education on  importance of OOB activity    Balance                                           ADL either performed or assessed with clinical judgement   ADL Overall ADL's : Needs assistance/impaired Eating/Feeding: Set up;Sitting Eating/Feeding Details (indicate cue type and reason): issues maintaining sitting with nausea at times Grooming: Min guard;Sitting;Standing   Upper Body Bathing: Min guard;Sitting   Lower Body Bathing: Minimal assistance;Sit to/from stand;Sitting/lateral leans   Upper Body Dressing : Min guard;Standing   Lower Body Dressing: Min guard;Sit to/from stand;Sitting/lateral leans   Toilet Transfer: Regular Toilet;BSC;Supervision/safety;Modified Independent Toilet Transfer Details (indicate cue type and reason): Pt been using BSC without assistance Toileting- Clothing Manipulation and Hygiene: Modified independent;Sit to/from stand;Sitting/lateral lean   Tub/ Shower Transfer: Minimal assistance;Shower seat   Functional mobility during ADLs: Min guard;Supervision/safety General ADL Comments: pt getting SOB, limited at this time due to nausea and fatigue     Vision Patient Visual Report: No change from baseline       Perception     Praxis      Pertinent Vitals/Pain Pain Assessment: No/denies pain     Hand Dominance     Extremity/Trunk Assessment Upper Extremity Assessment Upper Extremity Assessment: Generalized weakness   Lower Extremity Assessment Lower Extremity Assessment: Defer to PT evaluation  Communication Communication Communication: No difficulties   Cognition Arousal/Alertness: Awake/alert Behavior During Therapy: WFL for tasks assessed/performed Overall Cognitive Status: Within Functional Limits for tasks assessed                                     General Comments       Exercises     Shoulder Instructions      Home Living Family/patient expects to be discharged to:: Private residence Living  Arrangements: Other relatives(niece has been staying with since sick) Available Help at Discharge: Family Type of Home: House             Bathroom Shower/Tub: Walk-in shower         Home Equipment: None          Prior Functioning/Environment Level of Independence: Independent        Comments: pt stated "feeling like my normal self" up until 8 days ago, fully ind prior        OT Problem List: Decreased strength;Decreased activity tolerance;Decreased knowledge of use of DME or AE;Cardiopulmonary status limiting activity      OT Treatment/Interventions: Self-care/ADL training;DME and/or AE instruction;Therapeutic activities;Therapeutic exercise;Balance training;Energy conservation;Patient/family education    OT Goals(Current goals can be found in the care plan section) Acute Rehab OT Goals Patient Stated Goal: to feel better OT Goal Formulation: With patient Time For Goal Achievement: 04/29/19 Potential to Achieve Goals: Good  OT Frequency: Min 2X/week   Barriers to D/C:            Co-evaluation              AM-PAC OT "6 Clicks" Daily Activity     Outcome Measure Help from another person eating meals?: None Help from another person taking care of personal grooming?: None Help from another person toileting, which includes using toliet, bedpan, or urinal?: None Help from another person bathing (including washing, rinsing, drying)?: A Little Help from another person to put on and taking off regular upper body clothing?: None Help from another person to put on and taking off regular lower body clothing?: A Little 6 Click Score: 22   End of Session Equipment Utilized During Treatment: Oxygen  Activity Tolerance: Patient tolerated treatment well Patient left: in bed;with call bell/phone within reach;with nursing/sitter in room  OT Visit Diagnosis: Muscle weakness (generalized) (M62.81)                Time: 7253-6644 OT Time Calculation (min): 8  min Charges:  OT General Charges $OT Visit: 1 Visit OT Evaluation $OT Eval Low Complexity: 1 Low  Zenovia Jarred, MSOT, OTR/L Behavioral Health OT/ Acute Relief OT [facility] Office: [phone]   Zenovia Jarred 04/15/2019, 11:45 AM

## 2019-04-15 NOTE — Progress Notes (Signed)
PT Cancellation Note  Patient Details Name: Sandra Richardson MRN: [id number] DOB: 12-04-1950   Cancelled Treatment:    Reason Eval/Treat Not Completed: Patient declined, no reason specified  Patient resting in bed upon PT arrival. Pt declined to participate in evaluation due to stomach ache and nausea. She reports mobilizing independently to the bathroom to toilet and bathe herself. She requests PT to return at later time when she may be feeling better.   Kipp Brood, PT, DPT, [facility] Physical Therapist with [facility]  04/15/2019 2:41 PM

## 2019-04-15 NOTE — Progress Notes (Signed)
PROGRESS NOTE    Sandra Richardson  KNL:[id number] DOB: October 09, 1951 DOA: 04/11/2019 PCP: McLean-Scocuzza, Nino Glow, MD     Brief Narrative:  Sandra Richardson is a 68 y.o. female with past medical history significant for complex ovarian cyst who was in her usual state of health until about 8 days ago when she started develop weakness, nausea and vomiting.  She was unable to tolerate much food because of anorexia and the nausea.  She was seen at an outside ED last week where she was noted to have a mildly elevated calcium at 11.5 and was treated with IV hydration and Zofran with temporary improvement.  Patient was discharged home on Zofran however the nausea has worsened and is not being able to be controlled on the Zofran alone.  Patient now comes in for management of weakness nausea and vomiting. She denies fevers or chills. She denies any blood in her vomitus.  Denies any abdominal pain or swelling.  Does admit to a 20 pound weight loss over the past couple of months which was unintentional.  The patient is noted to have an elevated calcium at 12.5 in the ED.  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  Patient is being admitted for dehydration, hypercalcemia and work-up of her likely ovarian malignancy.  New events last 24 hours / Subjective: Patient has been able to ambulate to the bedside commode, trying to increase her nutrition.  Breathing has stabilized although remains on nasal cannula O2.  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 Hold ACE due to AKI on presentation.  Blood pressure stable 140/62  AKI  -Baseline Cr 1  -Back to baseline    HLD -Hold lipitor due to elevated LFTs   Elevated LFT -Continue to monitor LFT, patient does have tumor burden in the liver noted on on MRI abdomen  Leukocytosis -Appears to be reactive in nature, no source of infection at this point.  Continue to monitor CBC  Low back pain -Continue supportive care.  May need further imaging to rule out bony metastatic disease.  Back pain slightly improved today  Acute hypoxemic respiratory failure -Currently on 2L [place] O2, CXR independently review which reveals small right pleural effusion without over pulmonary edema. IVF dc'd.    DVT prophylaxis: Lovenox Code Status: Full Family Communication: Spoke with niece over the phone 6/5. Patient is unmarried and without biological children  Disposition Plan: Pending further work up, pathology report, improvement in nutrition and ambulation    Consultants:   GYN oncology  Procedures:   None   Antimicrobials:  Anti-infectives (From admission, onward)   None       Objective: Vitals:   04/14/19 0532 04/14/19 1402 04/14/19 2126 04/15/19 0415  BP: (!) 144/63 (!) 146/70 138/60 140/62  Pulse: (!) 101 (!) 102 98 (!) 101  Resp: 20  20 18   Temp: 97.7 F (36.5 C) 97.6 F (36.4 C) 98.1 F (36.7 C) 97.8 F (36.6 C)  TempSrc: Oral Oral Oral Oral  SpO2: 94% 95% 96% 95%  Weight:    122.8 kg  Height:        Intake/Output Summary (Last 24 hours) at 04/15/2019 0952 Last data filed at 04/15/2019 0130 Gross per 24 hour  Intake -  Output 650 ml  Net -650 ml   Filed Weights   04/13/19 0615  04/14/19 0530 04/15/19 0415  Weight: 121 kg 122.2 kg 122.8 kg   Examination: General exam: Appears calm and comfortable  Respiratory system: Diminished breath sounds bibasilar, no increase in respiratory effort, remains on nasal cannula O2 Cardiovascular system: S1 & S2 heard, tachycardic, regular rhythm. No JVD, murmurs, rubs, gallops or clicks. No pedal edema. Gastrointestinal system: Abdomen is nondistended, soft and nontender. No organomegaly or masses felt. Normal bowel sounds heard. Central nervous system: Alert and oriented. No focal neurological deficits. Extremities: Symmetric 5 x 5 power. Skin: No rashes, lesions or ulcers Psychiatry: Judgement and insight appear normal. Mood & affect appropriate.   Data Reviewed: I have personally reviewed following labs and imaging studies  CBC: Recent Labs  Lab 04/11/19 1621 04/12/19 0506 04/13/19 0534 04/14/19 0513 04/15/19 0513  WBC 16.9* 18.8* 18.0* 19.5* 19.0*  HGB 13.7 12.0 11.8* 11.9* 10.9*  HCT 43.2 38.6 38.4 38.3 34.6*  MCV 86.2 87.1 88.7 88.2 87.2  PLT 448* 381 359 384 086   Basic Metabolic Panel: Recent Labs  Lab 04/11/19 1621 04/12/19 0506 04/12/19 1314 04/13/19 0534 04/14/19 0513 04/15/19 0513  NA 132* 135  --  136 138 135  K 4.6 4.8  --  3.8 3.9 3.8  CL 98 105  --  109 110 107  CO2 20* 21*  --  18* 18* 19*  GLUCOSE 101* 75  --  85 82 117*  BUN 26* 27*  --  21 21 27*  CREATININE 1.26* 1.20*  --  0.95 0.89 1.02*  CALCIUM 12.4* 11.9* 11.6* 10.3 9.9 9.4   GFR: Estimated Creatinine Clearance: 70.1 mL/min (A) (by C-G formula based on SCr of 1.02 mg/dL (H)). Liver Function Tests: Recent Labs  Lab 04/11/19 1621 04/12/19 0506 04/13/19 0534 04/14/19 0513 04/15/19 0513  AST 140* 147* 148* 194* 253*  ALT 63* 57* 61* 74* 95*  ALKPHOS 326* 313* 302* 409* 651*  BILITOT 1.0 1.4* 1.5* 1.8* 2.0*  PROT 7.6 6.4* 6.3* 6.6 6.5  ALBUMIN 3.4* 2.8* 2.8* 2.9* 2.9*   Recent Labs  Lab 04/11/19 1621  LIPASE 39   No  results for input(s): AMMONIA in the last 168 hours. Coagulation Profile: Recent Labs  Lab 04/13/19 0957  INR 1.3*   Cardiac Enzymes: Recent Labs  Lab 04/11/19 1621  TROPONINI 0.03*   BNP (last 3 results) No results for input(s): PROBNP in the last 8760 hours. HbA1C: No results for input(s): HGBA1C in the last 72 hours. CBG: No results for input(s): GLUCAP in the last 168 hours. Lipid Profile: No results for input(s): CHOL, HDL, LDLCALC, TRIG, CHOLHDL, LDLDIRECT in the last 72 hours. Thyroid Function Tests: No results for input(s): TSH, T4TOTAL, FREET4, T3FREE, THYROIDAB in the last 72 hours. Anemia Panel: No results for input(s): VITAMINB12, FOLATE, FERRITIN, TIBC, IRON, RETICCTPCT in the last 72 hours. Sepsis Labs: No results for input(s): PROCALCITON, LATICACIDVEN in the last 168 hours.  Recent Results (from the  past 240 hour(s))  SARS Coronavirus 2 (CEPHEID - Performed in [place] hospital lab), Hosp Order     Status: None   Collection Time: 04/11/19  8:56 PM  Result Value Ref Range Status   SARS Coronavirus 2 NEGATIVE NEGATIVE Final    Comment: (NOTE) If result is NEGATIVE SARS-CoV-2 target nucleic acids are NOT DETECTED. The SARS-CoV-2 RNA is generally detectable in upper and lower  respiratory specimens during the acute phase of infection. The lowest  concentration of SARS-CoV-2 viral copies this assay can detect is 250  copies / mL. A negative result does not preclude SARS-CoV-2 infection  and should not be used as the sole basis for treatment or other  patient management decisions.  A negative result may occur with  improper specimen collection / handling, submission of specimen other  than nasopharyngeal swab, presence of viral mutation(s) within the  areas targeted by this assay, and inadequate number of viral copies  (<250 copies / mL). A negative result must be combined with clinical  observations, patient history, and epidemiological information. If  result is POSITIVE SARS-CoV-2 target nucleic acids are DETECTED. The SARS-CoV-2 RNA is generally detectable in upper and lower  respiratory specimens dur ing the acute phase of infection.  Positive  results are indicative of active infection with SARS-CoV-2.  Clinical  correlation with patient history and other diagnostic information is  necessary to determine patient infection status.  Positive results do  not rule out bacterial infection or co-infection with other viruses. If result is PRESUMPTIVE POSTIVE SARS-CoV-2 nucleic acids MAY BE PRESENT.   A presumptive positive result was obtained on the submitted specimen  and confirmed on repeat testing.  While 2019 novel coronavirus  (SARS-CoV-2) nucleic acids may be present in the submitted sample  additional confirmatory testing may be necessary for epidemiological  and / or clinical management purposes  to differentiate between  SARS-CoV-2 and other Sarbecovirus currently known to infect humans.  If clinically indicated additional testing with an alternate test  methodology [phone]) is advised. The SARS-CoV-2 RNA is generally  detectable in upper and lower respiratory sp ecimens during the acute  phase of infection. The expected result is Negative. Fact Sheet for Patients:  StrictlyIdeas.no Fact Sheet for Healthcare Providers: BankingDealers.co.za This test is not yet approved or cleared by the [place] FDA and has been authorized for detection and/or diagnosis of SARS-CoV-2 by FDA under an Emergency Use Authorization (EUA).  This EUA will remain in effect (meaning this test can be used) for the duration of the COVID-19 declaration under Section 564(b)(1) of the Act, 21 U.S.C. section 360bbb-3(b)(1), unless the authorization is terminated or revoked sooner. Performed at [facility], [address] [place], [place] [address]        Radiology Studies: US Biopsy (liver)   Result Date: 04/13/2019 INDICATION: Right adnexal mass, multiple pulmonary nodules and diffuse ill-defined masses in the liver. The patient presents for liver biopsy. EXAM: ULTRASOUND GUIDED CORE BIOPSY OF LIVER MEDICATIONS: None. ANESTHESIA/SEDATION: Fentanyl 100 mcg IV; Versed 2.0 mg IV Moderate Sedation Time:  16 minutes. The patient was continuously monitored during the procedure by the interventional radiology nurse under my direct supervision. PROCEDURE: The procedure, risks, benefits, and alternatives were explained to the patient. Questions regarding the procedure were encouraged and answered. The patient understands and consents to the procedure. Ultrasound was performed of the liver. The anterior abdominal wall was prepped with chlorhexidine in a sterile fashion, and a sterile drape was applied covering the operative  field. A sterile gown and sterile gloves were used for the procedure. Local anesthesia was provided with 1% Lidocaine. A 17 gauge trocar needle was advanced into the left lobe of the liver under ultrasound guidance. After confirming needle tip position, coaxial 18 gauge core biopsy samples were obtained. Three core biopsy samples were submitted in formalin. Gel-Foam pledgets were advanced through the outer needle as the needle was retracted. COMPLICATIONS: None immediate. FINDINGS: Lesions in the liver are very ill-defined by ultrasound and not discrete. An area of heterogeneous parenchyma in the left lobe was targeted. Solid tissue was obtained. IMPRESSION: Ultrasound-guided core biopsy performed in the left lobe of the liver. Electronically Signed   By: Aletta Edouard M.D.   On: 04/13/2019 17:17   Dg Chest Port 1 View  Result Date: 04/13/2019 CLINICAL DATA:  Status post right thoracentesis today. EXAM: PORTABLE CHEST 1 VIEW COMPARISON:  CT chest 04/12/2019. Single-view of the chest 04/11/2019. FINDINGS: Small right pleural effusion is seen. No pneumothorax. No left effusion. Heart size  is normal. Innumerable small bilateral pulmonary nodules are noted. IMPRESSION: Negative for pneumothorax after thoracentesis. Innumerable bilateral pulmonary nodules. Electronically Signed   By: Inge Rise M.D.   On: 04/13/2019 16:35      Scheduled Meds: . amLODipine  10 mg Oral Daily  . enoxaparin (LOVENOX) injection  40 mg Subcutaneous QHS  . feeding supplement  1 Container Oral BID BM  . feeding supplement (PRO-STAT SUGAR FREE 64)  30 mL Oral Daily  . multivitamin with minerals  1 tablet Oral Daily  . sodium chloride flush  3 mL Intravenous Q12H   Continuous Infusions:    LOS: 4 days    Time spent: 25 minutes   Dessa Phi, DO [facility] [email or web address] 04/15/2019, 9:52 AM

## 2019-04-15 NOTE — Plan of Care (Signed)
Patient medicated for pain in right shoulder x 1 this shift with improvement.  Also complains of a sharp pain to right upper abdomen, improved with Morphine.  Medicated for nausea x 1 this shift with improvement.  Continues with poor appetite, able to take sips of Ensure and eat some small bites of food.

## 2019-04-16 ENCOUNTER — Inpatient Hospital Stay (HOSPITAL_COMMUNITY): Payer: Managed Care, Other (non HMO)

## 2019-04-16 LAB — COMPREHENSIVE METABOLIC PANEL
ALT: 120 U/L — ABNORMAL HIGH (ref 0–44)
AST: 324 U/L — ABNORMAL HIGH (ref 15–41)
Albumin: 2.8 g/dL — ABNORMAL LOW (ref 3.5–5.0)
Alkaline Phosphatase: 794 U/L — ABNORMAL HIGH (ref 38–126)
Anion gap: 9 (ref 5–15)
BUN: 31 mg/dL — ABNORMAL HIGH (ref 8–23)
CO2: 20 mmol/L — ABNORMAL LOW (ref 22–32)
Calcium: 9.2 mg/dL (ref 8.9–10.3)
Chloride: 105 mmol/L (ref 98–111)
Creatinine, Ser: 1.07 mg/dL — ABNORMAL HIGH (ref 0.44–1.00)
GFR calc Af Amer: >60 mL/min (ref >60)
GFR calc non Af Amer: 54 mL/min — ABNORMAL LOW (ref >60)
Glucose, Bld: 100 mg/dL — ABNORMAL HIGH (ref 70–99)
Potassium: 3.9 mmol/L (ref 3.5–5.1)
Sodium: 134 mmol/L — ABNORMAL LOW (ref 135–145)
Total Bilirubin: 2.1 mg/dL — ABNORMAL HIGH (ref 0.3–1.2)
Total Protein: 6.4 g/dL — ABNORMAL LOW (ref 6.5–8.1)

## 2019-04-16 LAB — CBC
HCT: 33.3 % — ABNORMAL LOW (ref 36.0–46.0)
Hemoglobin: 10.7 g/dL — ABNORMAL LOW (ref 12.0–15.0)
MCH: 27.8 pg (ref 26.0–34.0)
MCHC: 32.1 g/dL (ref 30.0–36.0)
MCV: 86.5 fL (ref 80.0–100.0)
Platelets: 334 10*3/uL (ref 150–400)
RBC: 3.85 MIL/uL — ABNORMAL LOW (ref 3.87–5.11)
RDW: 14.4 % (ref 11.5–15.5)
WBC: 18.3 10*3/uL — ABNORMAL HIGH (ref 4.0–10.5)
nRBC: 0.6 % — ABNORMAL HIGH (ref 0.0–0.2)

## 2019-04-16 MED ORDER — SODIUM CHLORIDE 0.9 % IV SOLN
1.0000 g | INTRAVENOUS | Status: DC
  Administered 2019-04-16 – 2019-04-19 (×4): 1 g via INTRAVENOUS
  Filled 2019-04-16 (×4): qty 1

## 2019-04-16 MED ORDER — SODIUM CHLORIDE 0.9 % IV SOLN
500.0000 mg | INTRAVENOUS | Status: DC
  Administered 2019-04-16 – 2019-04-20 (×4): 500 mg via INTRAVENOUS
  Filled 2019-04-16 (×4): qty 500

## 2019-04-16 MED ORDER — SIMETHICONE 80 MG PO CHEW
80.0000 mg | CHEWABLE_TABLET | Freq: Four times a day (QID) | ORAL | Status: DC | PRN
  Administered 2019-04-16: 80 mg via ORAL
  Filled 2019-04-16: qty 1

## 2019-04-16 MED ORDER — PROMETHAZINE HCL 25 MG/ML IJ SOLN
6.2500 mg | Freq: Four times a day (QID) | INTRAMUSCULAR | Status: DC | PRN
  Administered 2019-04-16: 6.25 mg via INTRAVENOUS
  Filled 2019-04-16: qty 1

## 2019-04-16 NOTE — Progress Notes (Signed)
Occupational Therapy Treatment Patient Details Name: Sandra Richardson MRN: [id number] DOB: Sep 02, 1951 Today's Date: 04/16/2019    History of present illness Sandra Richardson is an 68 y.o. female with past medical history significant for complex ovarian cyst who was in her usual state of health until about 8 days ago when she started develop weakness, nausea and vomiting. 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  Patient is being admitted for dehydration, hypercalcemia and work-up of her likely ovarian malignancy   OT comments  Pt limited by N/V this session. Focused on energy conservation initially reviewing handout in full. Pt then mod I for bed mobility and while performing grooming (oral care) Pt with episode of N/V. RN aware and administering medication to assist. OT will continue to follow acutely.    Follow Up Recommendations  Home health OT    Equipment Recommendations  None recommended by OT(Pt has appropriate DME)    Recommendations for Other Services      Precautions / Restrictions Precautions Precautions: Fall Restrictions Weight Bearing Restrictions: No       Mobility Bed Mobility Overal bed mobility: Modified Independent             General bed mobility comments: use of bed rails, increased time  Transfers                 General transfer comment: deferred due to nausea - reports that she has been walking to bathroom for toilet needs with RN staff    Balance                                           ADL either performed or assessed with clinical judgement   ADL Overall ADL's : Needs assistance/impaired     Grooming: Set up;Sitting;Oral care;Wash/dry face Grooming Details (indicate cue type and reason): sitting and while brushing teeth started dry heaving                 Toilet Transfer: Min guard;Ambulation;Regular Glass blower/designer Details (indicate cue type and reason): Pt been  using BSC without assistance Toileting- Clothing Manipulation and Hygiene: Modified independent;Sit to/from stand;Sitting/lateral lean       Functional mobility during ADLs: Min guard;Supervision/safety General ADL Comments: Pt sat up long sitting in the bed, while performing grooming tasks she immediatly started      Vision       Perception     Praxis      Cognition Arousal/Alertness: Awake/alert Behavior During Therapy: WFL for tasks assessed/performed Overall Cognitive Status: Within Functional Limits for tasks assessed                                          Exercises     Shoulder Instructions       General Comments RN administering nausea meds at end of session    Pertinent Vitals/ Pain       Pain Assessment: No/denies pain(but nausea is big limiting factor)  Home Living                                          Prior Functioning/Environment  Frequency  Min 2X/week        Progress Toward Goals  OT Goals(current goals can now be found in the care plan section)  Progress towards OT goals: Progressing toward goals(limited by nausea)  Acute Rehab OT Goals Patient Stated Goal: to feel better OT Goal Formulation: With patient Time For Goal Achievement: 04/29/19 Potential to Achieve Goals: Good  Plan Discharge plan remains appropriate;Frequency remains appropriate    Co-evaluation                 AM-PAC OT "6 Clicks" Daily Activity     Outcome Measure   Help from another person eating meals?: None Help from another person taking care of personal grooming?: A Little Help from another person toileting, which includes using toliet, bedpan, or urinal?: A Little Help from another person bathing (including washing, rinsing, drying)?: A Little Help from another person to put on and taking off regular upper body clothing?: None Help from another person to put on and taking off regular lower body  clothing?: A Little 6 Click Score: 20    End of Session Equipment Utilized During Treatment: Oxygen(2L)  OT Visit Diagnosis: Muscle weakness (generalized) (M62.81)   Activity Tolerance Other (comment)(Patient limited by nausea)   Patient Left in bed;with call bell/phone within reach;with nursing/sitter in room   Nurse Communication Mobility status;Other (comment)(nausea)        Time: 6834-1962 OT Time Calculation (min): 23 min  Charges: OT General Charges $OT Visit: 1 Visit OT Treatments $Self Care/Home Management : 8-22 mins  Hulda Humphrey OTR/L Acute Rehabilitation Services Pager: [phone] Office: [place] 04/16/2019, 3:13 PM

## 2019-04-16 NOTE — Progress Notes (Addendum)
PROGRESS NOTE    Sandra Richardson  VVO:[id number] DOB: [birth year] DOA: 04/11/2019 PCP: McLean-Scocuzza, Nino Glow, MD     Brief Narrative:  Sandra Richardson is a 68 y.o. female with past medical history significant for complex ovarian cyst who was in her usual state of health until about 8 days ago when she started develop weakness, nausea and vomiting.  She was unable to tolerate much food because of anorexia and the nausea.  She was seen at an outside ED last week where she was noted to have a mildly elevated calcium at 11.5 and was treated with IV hydration and Zofran with temporary improvement.  Patient was discharged home on Zofran however the nausea has worsened and is not being able to be controlled on the Zofran alone.  Patient now comes in for management of weakness nausea and vomiting. She denies fevers or chills. She denies any blood in her vomitus.  Denies any abdominal pain or swelling.  Does admit to a 20 pound weight loss over the past couple of months which was unintentional.  The patient is noted to have an elevated calcium at 12.5 in the ED.  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  Patient is being admitted for dehydration, hypercalcemia and work-up of her likely ovarian malignancy.  New events last 24 hours / Subjective: Her right shoulder pain has now resolved, back pain has improved as well.  Not eating much, has some abdominal soreness and gas pains which she attributes to drinking protein drink with milk product.  Patient is lactose intolerant.  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 Hold ACE due to AKI on presentation.  Blood pressure stable 142/64  AKI  -Baseline Cr 1  -Back to baseline    HLD -Hold lipitor due to elevated LFTs   Elevated LFT -Continue to monitor LFT.  LFT slowly trending upward. Patient does have tumor burden in the liver noted on on MRI abdomen  Leukocytosis -Appears to be reactive in nature, no source of infection at this point.  Continue to monitor CBC  Low back pain -Continue supportive care.  May need further imaging to rule out bony metastatic disease.  Back pain slightly improved today  Acute hypoxemic respiratory failure -Currently on 2L [place] O2, CXR independently review which reveals small right pleural effusion without over pulmonary edema. IVF dc'd.  Repeat chest x-ray ordered today   DVT prophylaxis: Lovenox Code Status: Full Family Communication: Spoke with niece over the phone 6/7. Patient is unmarried and without biological children  Disposition Plan: Pending further work up, pathology report, improvement in nutrition and ambulation    Consultants:   GYN oncology  Procedures:   None   Antimicrobials:  Anti-infectives (From admission, onward)   None       Objective: Vitals:  04/15/19 0415 04/15/19 1321 04/15/19 2117 04/16/19 0611  BP: 140/62 (!) 146/73 (!) 152/67 (!) 142/64  Pulse: (!) 101 98 (!) 102 (!) 102  Resp: 18 18 20 20   Temp: 97.8 F (36.6 C) 97.6 F (36.4 C) 97.6 F (36.4 C) 97.8 F (36.6 C)  TempSrc: Oral Oral Oral Oral  SpO2: 95% 90% 94% 93%  Weight: 122.8 kg   123.3 kg  Height:        Intake/Output Summary (Last 24 hours) at 04/16/2019 1002 Last data filed at  04/15/2019 1328 Gross per 24 hour  Intake -  Output 200 ml  Net -200 ml   Filed Weights   04/14/19 0530 04/15/19 0415 04/16/19 0611  Weight: 122.2 kg 122.8 kg 123.3 kg   Examination: General exam: Appears calm and comfortable, fatigued appearing Respiratory system: Diminished breath sounds bibasilar, in no acute respiratory distress, slightly tachypneic respiratory rate 20 Cardiovascular system: S1 & S2 heard, tachycardic, regular rhythm Gastrointestinal system: Abdomen is nondistended, soft and mildly tender to palpation right upper quadrant and epigastric Central nervous system: Alert and oriented. No focal neurological deficits. Extremities: Symmetric 5 x 5 power. Skin: No rashes, lesions or ulcers Psychiatry: Judgement and insight appear normal. Mood & affect appropriate.    Data Reviewed: I have personally reviewed following labs and imaging studies  CBC: Recent Labs  Lab 04/12/19 0506 04/13/19 0534 04/14/19 0513 04/15/19 0513 04/16/19 0431  WBC 18.8* 18.0* 19.5* 19.0* 18.3*  HGB 12.0 11.8* 11.9* 10.9* 10.7*  HCT 38.6 38.4 38.3 34.6* 33.3*  MCV 87.1 88.7 88.2 87.2 86.5  PLT 381 359 384 356 161   Basic Metabolic Panel: Recent Labs  Lab 04/12/19 0506 04/12/19 1314 04/13/19 0534 04/14/19 0513 04/15/19 0513 04/16/19 0431  NA 135  --  136 138 135 134*  K 4.8  --  3.8 3.9 3.8 3.9  CL 105  --  109 110 107 105  CO2 21*  --  18* 18* 19* 20*  GLUCOSE 75  --  85 82 117* 100*  BUN 27*  --  21 21 27* 31*  CREATININE 1.20*  --  0.95 0.89 1.02* 1.07*  CALCIUM 11.9* 11.6* 10.3 9.9 9.4 9.2   GFR: Estimated Creatinine Clearance: 67 mL/min (A) (by C-G formula based on SCr of 1.07 mg/dL (H)). Liver Function Tests: Recent Labs  Lab 04/12/19 0506 04/13/19 0534 04/14/19 0513 04/15/19 0513 04/16/19 0431  AST 147* 148* 194* 253* 324*  ALT 57* 61* 74* 95* 120*  ALKPHOS 313* 302* 409* 651* 794*  BILITOT 1.4* 1.5* 1.8* 2.0* 2.1*  PROT 6.4* 6.3* 6.6 6.5 6.4*  ALBUMIN 2.8*  2.8* 2.9* 2.9* 2.8*   Recent Labs  Lab 04/11/19 1621  LIPASE 39   No results for input(s): AMMONIA in the last 168 hours. Coagulation Profile: Recent Labs  Lab 04/13/19 0957  INR 1.3*   Cardiac Enzymes: Recent Labs  Lab 04/11/19 1621  TROPONINI 0.03*   BNP (last 3 results) No results for input(s): PROBNP in the last 8760 hours. HbA1C: No results for input(s): HGBA1C in the last 72 hours. CBG: No results for input(s): GLUCAP in the last 168 hours. Lipid Profile: No results for input(s): CHOL, HDL, LDLCALC, TRIG, CHOLHDL, LDLDIRECT in the last 72 hours. Thyroid Function Tests: No results for input(s): TSH, T4TOTAL, FREET4, T3FREE, THYROIDAB in the last 72 hours. Anemia Panel: No results for input(s): VITAMINB12, FOLATE, FERRITIN, TIBC, IRON, RETICCTPCT in the last 72 hours. Sepsis Labs: No results for input(s): PROCALCITON, LATICACIDVEN  in the last 168 hours.  Recent Results (from the past 240 hour(s))  SARS Coronavirus 2 (CEPHEID - Performed in [place] hospital lab), Hosp Order     Status: None   Collection Time: 04/11/19  8:56 PM  Result Value Ref Range Status   SARS Coronavirus 2 NEGATIVE NEGATIVE Final    Comment: (NOTE) If result is NEGATIVE SARS-CoV-2 target nucleic acids are NOT DETECTED. The SARS-CoV-2 RNA is generally detectable in upper and lower  respiratory specimens during the acute phase of infection. The lowest  concentration of SARS-CoV-2 viral copies this assay can detect is 250  copies / mL. A negative result does not preclude SARS-CoV-2 infection  and should not be used as the sole basis for treatment or other  patient management decisions.  A negative result may occur with  improper specimen collection / handling, submission of specimen other  than nasopharyngeal swab, presence of viral mutation(s) within the  areas targeted by this assay, and inadequate number of viral copies  (<250 copies / mL). A negative result must be combined with  clinical  observations, patient history, and epidemiological information. If result is POSITIVE SARS-CoV-2 target nucleic acids are DETECTED. The SARS-CoV-2 RNA is generally detectable in upper and lower  respiratory specimens dur ing the acute phase of infection.  Positive  results are indicative of active infection with SARS-CoV-2.  Clinical  correlation with patient history and other diagnostic information is  necessary to determine patient infection status.  Positive results do  not rule out bacterial infection or co-infection with other viruses. If result is PRESUMPTIVE POSTIVE SARS-CoV-2 nucleic acids MAY BE PRESENT.   A presumptive positive result was obtained on the submitted specimen  and confirmed on repeat testing.  While 2019 novel coronavirus  (SARS-CoV-2) nucleic acids may be present in the submitted sample  additional confirmatory testing may be necessary for epidemiological  and / or clinical management purposes  to differentiate between  SARS-CoV-2 and other Sarbecovirus currently known to infect humans.  If clinically indicated additional testing with an alternate test  methodology [id number]) is advised. The SARS-CoV-2 RNA is generally  detectable in upper and lower respiratory sp ecimens during the acute  phase of infection. The expected result is Negative. Fact Sheet for Patients:  StrictlyIdeas.no Fact Sheet for Healthcare Providers: BankingDealers.co.za This test is not yet approved or cleared by the [place] FDA and has been authorized for detection and/or diagnosis of SARS-CoV-2 by FDA under an Emergency Use Authorization (EUA).  This EUA will remain in effect (meaning this test can be used) for the duration of the COVID-19 declaration under Section 564(b)(1) of the Act, 21 U.S.C. section 360bbb-3(b)(1), unless the authorization is terminated or revoked sooner. Performed at [facility], [address],  [place], [place] [address]        Radiology Studies: No results found.    Scheduled Meds: . amLODipine  10 mg Oral Daily  . enoxaparin (LOVENOX) injection  40 mg Subcutaneous QHS  . feeding supplement  1 Container Oral BID BM  . feeding supplement (PRO-STAT SUGAR FREE 64)  30 mL Oral Daily  . multivitamin with minerals  1 tablet Oral Daily  . sodium chloride flush  3 mL Intravenous Q12H   Continuous Infusions:    LOS: 5 days    Time spent: 25 minutes   Dessa Phi, DO [facility] [email or web address] 04/16/2019, 10:02 AM

## 2019-04-16 NOTE — Progress Notes (Signed)
PT Cancellation Note  Patient Details Name: Sandra Richardson MRN: [id number] DOB: 01-10-1951   Cancelled Treatment:    Reason Eval/Treat Not Completed: Attempted PT eval-pt declined participation at this time. She reported she recently had an episode of N/V. She did not feel up to working with PT. Will check back another day.    Weston Anna, PT Acute Rehabilitation Services Pager: [phone] Office: [phone]

## 2019-04-17 LAB — COMPREHENSIVE METABOLIC PANEL
ALT: 139 U/L — ABNORMAL HIGH (ref 0–44)
AST: 411 U/L — ABNORMAL HIGH (ref 15–41)
Albumin: 2.5 g/dL — ABNORMAL LOW (ref 3.5–5.0)
Alkaline Phosphatase: 828 U/L — ABNORMAL HIGH (ref 38–126)
Anion gap: 12 (ref 5–15)
BUN: 35 mg/dL — ABNORMAL HIGH (ref 8–23)
CO2: 18 mmol/L — ABNORMAL LOW (ref 22–32)
Calcium: 8.9 mg/dL (ref 8.9–10.3)
Chloride: 105 mmol/L (ref 98–111)
Creatinine, Ser: 1.08 mg/dL — ABNORMAL HIGH (ref 0.44–1.00)
GFR calc Af Amer: >60 mL/min (ref >60)
GFR calc non Af Amer: 53 mL/min — ABNORMAL LOW (ref >60)
Glucose, Bld: 91 mg/dL (ref 70–99)
Potassium: 4.3 mmol/L (ref 3.5–5.1)
Sodium: 135 mmol/L (ref 135–145)
Total Bilirubin: 2.1 mg/dL — ABNORMAL HIGH (ref 0.3–1.2)
Total Protein: 6.3 g/dL — ABNORMAL LOW (ref 6.5–8.1)

## 2019-04-17 LAB — CBC
HCT: 34.8 % — ABNORMAL LOW (ref 36.0–46.0)
Hemoglobin: 11 g/dL — ABNORMAL LOW (ref 12.0–15.0)
MCH: 27.9 pg (ref 26.0–34.0)
MCHC: 31.6 g/dL (ref 30.0–36.0)
MCV: 88.3 fL (ref 80.0–100.0)
Platelets: 333 10*3/uL (ref 150–400)
RBC: 3.94 MIL/uL (ref 3.87–5.11)
RDW: 14.8 % (ref 11.5–15.5)
WBC: 18 10*3/uL — ABNORMAL HIGH (ref 4.0–10.5)
nRBC: 1.3 % — ABNORMAL HIGH (ref 0.0–0.2)

## 2019-04-17 LAB — PTH-RELATED PEPTIDE: PTH-related peptide: <2 pmol/L

## 2019-04-17 MED ORDER — ONDANSETRON HCL 4 MG/2ML IJ SOLN
4.0000 mg | Freq: Four times a day (QID) | INTRAMUSCULAR | Status: DC
  Administered 2019-04-17 – 2019-04-20 (×9): 4 mg via INTRAVENOUS
  Filled 2019-04-17 (×11): qty 2

## 2019-04-17 MED ORDER — PROMETHAZINE HCL 25 MG/ML IJ SOLN
6.2500 mg | Freq: Four times a day (QID) | INTRAMUSCULAR | Status: DC
  Administered 2019-04-17 – 2019-04-20 (×10): 6.25 mg via INTRAVENOUS
  Filled 2019-04-17 (×10): qty 1

## 2019-04-17 NOTE — Evaluation (Signed)
Physical Therapy Evaluation Patient Details Name: Sandra Richardson MRN: [id number] DOB: 1950/12/05 Today's Date: 04/17/2019   History of Present Illness  Sandra Richardson is an 68 y.o. female with past medical history significant for complex ovarian cyst who was in her usual state of health until about 8 days ago when she started develop weakness, nausea and vomiting. 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  Patient is being admitted for dehydration, hypercalcemia and work-up of her likely ovarian malignancy  Clinical Impression  Pt admitted with above diagnosis. Pt currently with functional limitations due to the deficits listed below (see PT Problem List). Pt will benefit from skilled PT to increase their independence and safety with mobility to allow discharge to the venue listed below.  Pt ambulated within room (did not wish to go into hallway today).  Pt requiring supplemental oxygen at this time and does not typically use at baseline.  Pt reports her family can assist her as needed.  Pt fatigues quickly.     Follow Up Recommendations Home health PT;Supervision for mobility/OOB    Equipment Recommendations  Rolling walker with 5" wheels(pt reports she can borrow one)    Recommendations for Other Services       Precautions / Restrictions Precautions Precautions: Fall Precaution Comments: monitor sats      Mobility  Bed Mobility Overal bed mobility: Modified Independent             General bed mobility comments: use of bed rails, increased time  Transfers Overall transfer level: Needs assistance Equipment used: None Transfers: Sit to/from Stand Sit to Stand: Min guard         General transfer comment: increased time and effort however no physical assist required  Ambulation/Gait Ambulation/Gait assistance: Min guard Gait Distance (Feet): 22 Feet(x2) Assistive device: IV Pole Gait Pattern/deviations: Step-through pattern;Decreased  stride length     General Gait Details: initially ambulated pushing IV pole around room without O2 however reports mod SOB and SPO2 91% on 1L with seated rest break (pt requested reapply and pulse ox not readily available upon first sitting).  Pt felt able to ambulate again around room another 22 feet on 2L O2 [place] and SPo2 93% with less SOB per pt  Stairs            Wheelchair Mobility    Modified Rankin (Stroke Patients Only)       Balance Overall balance assessment: Needs assistance         Standing balance support: No upper extremity supported Standing balance-Leahy Scale: Fair                               Pertinent Vitals/Pain Pain Assessment: No/denies pain    Home Living Family/patient expects to be discharged to:: Private residence Living Arrangements: Other relatives(niece) Available Help at Discharge: Family Type of Home: House       Home Layout: Able to live on main level with bedroom/bathroom;Two level Home Equipment: None Additional Comments: can borrow RW from family    Prior Function Level of Independence: Independent               Hand Dominance        Extremity/Trunk Assessment        Lower Extremity Assessment Lower Extremity Assessment: Generalized weakness    Cervical / Trunk Assessment Cervical / Trunk Assessment: Normal  Communication   Communication:  No difficulties  Cognition Arousal/Alertness: Awake/alert Behavior During Therapy: WFL for tasks assessed/performed Overall Cognitive Status: Within Functional Limits for tasks assessed                                        General Comments      Exercises     Assessment/Plan    PT Assessment Patient needs continued PT services  PT Problem List Decreased strength;Decreased balance;Decreased mobility;Decreased activity tolerance;Cardiopulmonary status limiting activity;Decreased knowledge of use of DME       PT Treatment  Interventions DME instruction;Functional mobility training;Balance training;Patient/family education;Gait training;Therapeutic activities;Stair training;Therapeutic exercise    PT Goals (Current goals can be found in the Care Plan section)  Acute Rehab PT Goals PT Goal Formulation: With patient Time For Goal Achievement: 05/01/19 Potential to Achieve Goals: Good    Frequency Min 3X/week   Barriers to discharge        Co-evaluation               AM-PAC PT "6 Clicks" Mobility  Outcome Measure Help needed turning from your back to your side while in a flat bed without using bedrails?: None Help needed moving from lying on your back to sitting on the side of a flat bed without using bedrails?: None Help needed moving to and from a bed to a chair (including a wheelchair)?: A Little Help needed standing up from a chair using your arms (e.g., wheelchair or bedside chair)?: A Little Help needed to walk in hospital room?: A Little Help needed climbing 3-5 steps with a railing? : A Little 6 Click Score: 20    End of Session Equipment Utilized During Treatment: Oxygen Activity Tolerance: Patient tolerated treatment well Patient left: in bed;with call bell/phone within reach;with bed alarm set   PT Visit Diagnosis: Difficulty in walking, not elsewhere classified (R26.2)    Time: 3893-7342 PT Time Calculation (min) (ACUTE ONLY): 19 min   Charges:   PT Evaluation $PT Eval Low Complexity: [place], PT, DPT Acute Rehabilitation Services Office: [phone] Pager: [phone] Trena Platt 04/17/2019, 3:24 PM

## 2019-04-17 NOTE — Progress Notes (Addendum)
PROGRESS NOTE    Sandra Richardson  AST:[id number] DOB: 1951-08-05 DOA: 04/11/2019 PCP: McLean-Scocuzza, Nino Glow, MD     Brief Narrative:  Sandra Richardson is a 68 y.o. female with past medical history significant for complex ovarian cyst who was in her usual state of health until about 8 days ago when she started develop weakness, nausea and vomiting.  She was unable to tolerate much food because of anorexia and the nausea.  She was seen at an outside ED last week where she was noted to have a mildly elevated calcium at 11.5 and was treated with IV hydration and Zofran with temporary improvement.  Patient was discharged home on Zofran however the nausea has worsened and is not being able to be controlled on the Zofran alone.  Patient now comes in for management of weakness nausea and vomiting. She denies fevers or chills. She denies any blood in her vomitus.  Denies any abdominal pain or swelling.  Does admit to a 20 pound weight loss over the past couple of months which was unintentional.  The patient is noted to have an elevated calcium at 12.5 in the ED.  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  Patient is being admitted for dehydration, hypercalcemia and work-up of her likely ovarian malignancy.  New events last 24 hours / Subjective: No new complaints, continues to be very weak.  Some nausea and vomiting as well today.  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  Repeat chest x-ray reveals right lobar pneumonia.  It is my belief that it is likely that pneumonia was present on admission but is more evident on chest x-ray with IV fluid resuscitation -Rocephin, azithromycin   Acute hypoxemic respiratory failure -Currently on 2L [place] O2  Elevated LFT -Continue to monitor LFT.  LFT slowly trending upward. Patient does have tumor burden in the liver noted on on MRI abdomen  HLD -Hold lipitor due to elevated LFTs   Low back pain -Continue supportive care.  May need further imaging to rule out bony metastatic disease.  Back pain slightly improved today  Essential hypertension -Resume amlodipine. Hold ACE due to AKI on presentation.  Blood pressure stable 137/61  AKI  -Baseline Cr 1  -Back to baseline     DVT prophylaxis: Lovenox Code Status: Full Family Communication: Spoke with niece over the phone. Patient is unmarried and without biological children  Disposition Plan: Pending further work up, pathology report, improvement in nutrition and ambulation.  Spent significant time filling out FMLA paperwork for patient today   Consultants:   GYN oncology  Procedures:   None   Antimicrobials:  Anti-infectives (From admission, onward)  Start     Dose/Rate Route Frequency Ordered Stop   04/16/19 2000  azithromycin (ZITHROMAX) 500 mg in sodium chloride 0.9 % 250 mL IVPB     500 mg 250 mL/hr over 60 Minutes Intravenous Every 24 hours 04/16/19 1737     04/16/19 1800  cefTRIAXone (ROCEPHIN) 1 g in sodium chloride 0.9 % 100 mL IVPB     1 g 200 mL/hr over 30 Minutes Intravenous Every 24 hours 04/16/19 1737          Objective: Vitals:   04/16/19 0611 04/16/19 1422 04/17/19 0517 04/17/19 0552  BP: (!) 142/64 (!) 152/59 137/61   Pulse: (!) 102 (!) 101 (!) 101   Resp: 20 20 18    Temp: 97.8 F (36.6 C)  98.5 F (36.9 C)   TempSrc: Oral Oral Oral   SpO2: 93% 93% 94%   Weight: 123.3 kg   122.5 kg  Height:        Intake/Output Summary (Last 24 hours) at 04/17/2019 1315 Last data filed at 04/17/2019 1100 Gross per 24 hour  Intake 640 ml  Output 900 ml  Net -260 ml   Filed Weights   04/15/19 0415 04/16/19 0611 04/17/19 0552  Weight: 122.8 kg 123.3 kg 122.5 kg    Examination: General exam: Appears calm and comfortable  Respiratory system: Diminished breath sounds right base, respiratory effort normal on 2 L nasal cannula O2 Cardiovascular system: S1 & S2 heard, tachycardic, regular rhythm. No JVD, murmurs, rubs, gallops or clicks. No pedal edema. Gastrointestinal system: Abdomen is nondistended, soft and nontender. No organomegaly or masses felt. Normal bowel sounds heard. Central nervous system: Alert and oriented. No focal neurological deficits. Extremities: Symmetric 5 x 5 power. Skin: No rashes, lesions or ulcers Psychiatry: Judgement and insight appear normal. Mood & affect appropriate.    Data Reviewed: I have personally reviewed following labs and imaging studies  CBC: Recent Labs  Lab 04/13/19 0534 04/14/19 0513 04/15/19 0513 04/16/19 0431 04/17/19 0428  WBC 18.0* 19.5* 19.0* 18.3* 18.0*  HGB 11.8* 11.9* 10.9* 10.7* 11.0*  HCT 38.4 38.3 34.6* 33.3* 34.8*  MCV 88.7 88.2 87.2 86.5 88.3  PLT 359 384 356 334 326   Basic Metabolic Panel: Recent Labs  Lab 04/13/19 0534 04/14/19 0513 04/15/19 0513 04/16/19 0431 04/17/19 0428  NA 136 138 135 134* 135  K 3.8 3.9 3.8 3.9 4.3  CL 109 110 107 105 105  CO2 18* 18* 19* 20* 18*  GLUCOSE 85 82 117* 100* 91  BUN 21 21 27* 31* 35*  CREATININE 0.95 0.89 1.02* 1.07* 1.08*  CALCIUM 10.3 9.9 9.4 9.2 8.9   GFR: Estimated  Creatinine Clearance: 66.2 mL/min (A) (by C-G formula based on SCr of 1.08 mg/dL (H)). Liver Function Tests: Recent Labs  Lab 04/13/19 0534 04/14/19 0513 04/15/19 0513 04/16/19 0431 04/17/19 0428  AST 148* 194* 253* 324* 411*  ALT 61* 74* 95* 120* 139*  ALKPHOS 302* 409* 651* 794* 828*  BILITOT 1.5* 1.8* 2.0* 2.1* 2.1*  PROT 6.3* 6.6 6.5 6.4* 6.3*  ALBUMIN 2.8* 2.9* 2.9* 2.8* 2.5*   Recent Labs  Lab 04/11/19 1621  LIPASE 39   No results for input(s): AMMONIA in the last 168 hours. Coagulation Profile: Recent Labs  Lab 04/13/19 0957  INR 1.3*   Cardiac Enzymes: Recent Labs  Lab 04/11/19 1621  TROPONINI 0.03*   BNP (last 3 results) No results for input(s): PROBNP in the last 8760 hours. HbA1C: No results for input(s): HGBA1C in the  last 72 hours. CBG: No results for input(s): GLUCAP in the last 168 hours. Lipid Profile: No results for input(s): CHOL, HDL, LDLCALC, TRIG, CHOLHDL, LDLDIRECT in the last 72 hours. Thyroid Function Tests: No results for input(s): TSH, T4TOTAL, FREET4, T3FREE, THYROIDAB in the last 72 hours. Anemia Panel: No results for input(s): VITAMINB12, FOLATE, FERRITIN, TIBC, IRON, RETICCTPCT in the last 72 hours. Sepsis Labs: No results for input(s): PROCALCITON, LATICACIDVEN in the last 168 hours.  Recent Results (from the past 240 hour(s))  SARS Coronavirus 2 (CEPHEID - Performed in [place] hospital lab), Hosp Order     Status: None   Collection Time: 04/11/19  8:56 PM  Result Value Ref Range Status   SARS Coronavirus 2 NEGATIVE NEGATIVE Final    Comment: (NOTE) If result is NEGATIVE SARS-CoV-2 target nucleic acids are NOT DETECTED. The SARS-CoV-2 RNA is generally detectable in upper and lower  respiratory specimens during the acute phase of infection. The lowest  concentration of SARS-CoV-2 viral copies this assay can detect is 250  copies / mL. A negative result does not preclude SARS-CoV-2 infection  and should not be used as the  sole basis for treatment or other  patient management decisions.  A negative result may occur with  improper specimen collection / handling, submission of specimen other  than nasopharyngeal swab, presence of viral mutation(s) within the  areas targeted by this assay, and inadequate number of viral copies  (<250 copies / mL). A negative result must be combined with clinical  observations, patient history, and epidemiological information. If result is POSITIVE SARS-CoV-2 target nucleic acids are DETECTED. The SARS-CoV-2 RNA is generally detectable in upper and lower  respiratory specimens dur ing the acute phase of infection.  Positive  results are indicative of active infection with SARS-CoV-2.  Clinical  correlation with patient history and other diagnostic information is  necessary to determine patient infection status.  Positive results do  not rule out bacterial infection or co-infection with other viruses. If result is PRESUMPTIVE POSTIVE SARS-CoV-2 nucleic acids MAY BE PRESENT.   A presumptive positive result was obtained on the submitted specimen  and confirmed on repeat testing.  While 2019 novel coronavirus  (SARS-CoV-2) nucleic acids may be present in the submitted sample  additional confirmatory testing may be necessary for epidemiological  and / or clinical management purposes  to differentiate between  SARS-CoV-2 and other Sarbecovirus currently known to infect humans.  If clinically indicated additional testing with an alternate test  methodology [phone]) is advised. The SARS-CoV-2 RNA is generally  detectable in upper and lower respiratory sp ecimens during the acute  phase of infection. The expected result is Negative. Fact Sheet for Patients:  StrictlyIdeas.no Fact Sheet for Healthcare Providers: BankingDealers.co.za This test is not yet approved or cleared by the [place] FDA and has been authorized for detection  and/or diagnosis of SARS-CoV-2 by FDA under an Emergency Use Authorization (EUA).  This EUA will remain in effect (meaning this test can be used) for the duration of the COVID-19 declaration under Section 564(b)(1) of the Act, 21 U.S.C. section 360bbb-3(b)(1), unless the authorization is terminated or revoked sooner. Performed at [facility], [address], [place], [place] [address]        Radiology Studies: Dg Chest 2 View  Result Date: 04/16/2019 CLINICAL DATA:  Respiratory failure EXAM: CHEST - 2 VIEW COMPARISON:  April 13, 2019 chest radiograph and chest CT April 12, 2019 FINDINGS: There is a fairly small pleural effusion on  the right with right base atelectasis and airspace consolidation. Pulmonary nodular lesions are seen throughout the lungs, better delineated on recent CT. Heart size and pulmonary vascularity are normal. No adenopathy demonstrable. No bone lesions. IMPRESSION: Fairly small right pleural effusion with atelectasis and consolidation in the right base region. Suspect pneumonia in the right base. Small pulmonary nodular lesions throughout the lungs diffusely are noted, better seen on recent CT. Cardiac silhouette is within normal limits. No adenopathy is demonstrable by radiography. Electronically Signed   By: Lowella Grip III M.D.   On: 04/16/2019 15:13      Scheduled Meds: . amLODipine  10 mg Oral Daily  . enoxaparin (LOVENOX) injection  40 mg Subcutaneous QHS  . feeding supplement  1 Container Oral BID BM  . feeding supplement (PRO-STAT SUGAR FREE 64)  30 mL Oral Daily  . multivitamin with minerals  1 tablet Oral Daily  . ondansetron (ZOFRAN) IV  4 mg Intravenous Q6H  . promethazine  6.25 mg Intravenous Q6H  . sodium chloride flush  3 mL Intravenous Q12H   Continuous Infusions: . azithromycin 500 mg (04/16/19 1933)  . cefTRIAXone (ROCEPHIN)  IV 1 g (04/16/19 1757)     LOS: 6 days    Time spent: 25 minutes   Dessa Phi, DO [facility]  [email or web address] 04/17/2019, 1:15 PM

## 2019-04-18 ENCOUNTER — Other Ambulatory Visit: Payer: Managed Care, Other (non HMO)

## 2019-04-18 LAB — BASIC METABOLIC PANEL
Anion gap: 12 (ref 5–15)
BUN: 36 mg/dL — ABNORMAL HIGH (ref 8–23)
CO2: 16 mmol/L — ABNORMAL LOW (ref 22–32)
Calcium: 8.6 mg/dL — ABNORMAL LOW (ref 8.9–10.3)
Chloride: 107 mmol/L (ref 98–111)
Creatinine, Ser: 1.1 mg/dL — ABNORMAL HIGH (ref 0.44–1.00)
GFR calc Af Amer: >60 mL/min (ref >60)
GFR calc non Af Amer: 52 mL/min — ABNORMAL LOW (ref >60)
Glucose, Bld: 80 mg/dL (ref 70–99)
Potassium: 4.7 mmol/L (ref 3.5–5.1)
Sodium: 135 mmol/L (ref 135–145)

## 2019-04-18 LAB — CBC
HCT: 33.1 % — ABNORMAL LOW (ref 36.0–46.0)
Hemoglobin: 10.8 g/dL — ABNORMAL LOW (ref 12.0–15.0)
MCH: 27.3 pg (ref 26.0–34.0)
MCHC: 32.6 g/dL (ref 30.0–36.0)
MCV: 83.6 fL (ref 80.0–100.0)
Platelets: 345 10*3/uL (ref 150–400)
RBC: 3.96 MIL/uL (ref 3.87–5.11)
RDW: 14.2 % (ref 11.5–15.5)
WBC: 22.4 10*3/uL — ABNORMAL HIGH (ref 4.0–10.5)
nRBC: 1.6 % — ABNORMAL HIGH (ref 0.0–0.2)

## 2019-04-18 LAB — MAGNESIUM: Magnesium: 2.8 mg/dL — ABNORMAL HIGH (ref 1.7–2.4)

## 2019-04-18 LAB — HEPATIC FUNCTION PANEL
ALT: 138 U/L — ABNORMAL HIGH (ref 0–44)
AST: 430 U/L — ABNORMAL HIGH (ref 15–41)
Albumin: 2.4 g/dL — ABNORMAL LOW (ref 3.5–5.0)
Alkaline Phosphatase: 907 U/L — ABNORMAL HIGH (ref 38–126)
Bilirubin, Direct: 1.6 mg/dL — ABNORMAL HIGH (ref 0.0–0.2)
Indirect Bilirubin: 0.4 mg/dL (ref 0.3–0.9)
Total Bilirubin: 2 mg/dL — ABNORMAL HIGH (ref 0.3–1.2)
Total Protein: 6.4 g/dL — ABNORMAL LOW (ref 6.5–8.1)

## 2019-04-18 MED ORDER — LORAZEPAM 2 MG/ML IJ SOLN
0.5000 mg | Freq: Once | INTRAMUSCULAR | Status: AC
  Administered 2019-04-18: 0.5 mg via INTRAVENOUS
  Filled 2019-04-18: qty 1

## 2019-04-18 MED ORDER — IPRATROPIUM-ALBUTEROL 0.5-2.5 (3) MG/3ML IN SOLN
3.0000 mL | Freq: Three times a day (TID) | RESPIRATORY_TRACT | Status: DC
  Administered 2019-04-19 – 2019-04-20 (×5): 3 mL via RESPIRATORY_TRACT
  Filled 2019-04-18 (×5): qty 3

## 2019-04-18 MED ORDER — IPRATROPIUM-ALBUTEROL 0.5-2.5 (3) MG/3ML IN SOLN
3.0000 mL | Freq: Four times a day (QID) | RESPIRATORY_TRACT | Status: DC
  Administered 2019-04-18 (×3): 3 mL via RESPIRATORY_TRACT
  Filled 2019-04-18 (×3): qty 3

## 2019-04-18 MED ORDER — ALBUTEROL SULFATE (2.5 MG/3ML) 0.083% IN NEBU: 2.5000 mg | INHALATION_SOLUTION | Freq: Four times a day (QID) | RESPIRATORY_TRACT | Status: DC | PRN

## 2019-04-18 NOTE — Progress Notes (Signed)
OT Cancellation Note  Patient Details Name: Sandra Richardson MRN: [id number] DOB: 03-06-1951   Cancelled Treatment:    Reason Eval/Treat Not Completed: Fatigue/lethargy limiting ability to participate  Pt just returned from [facility]. Pt exhausted and ask if OT could return next day  Kari Baars, [place] [phone] Office- [place] D 04/18/2019, 1:48 PM

## 2019-04-18 NOTE — Progress Notes (Addendum)
PROGRESS NOTE    Sandra Richardson  YBO:[id number] DOB: August 14, 1951 DOA: 04/11/2019 PCP: McLean-Scocuzza, Nino Glow, MD     Brief Narrative:  Sandra Richardson is a 68 y.o. female with past medical history significant for complex ovarian cyst who was in her usual state of health until about 8 days ago when she started develop weakness, nausea and vomiting.  She was unable to tolerate much food because of anorexia and the nausea.  She was seen at an outside ED last week where she was noted to have a mildly elevated calcium at 11.5 and was treated with IV hydration and Zofran with temporary improvement.  Patient was discharged home on Zofran however the nausea has worsened and is not being able to be controlled on the Zofran alone.  Patient now comes in for management of weakness nausea and vomiting. She denies fevers or chills. She denies any blood in her vomitus.  Denies any abdominal pain or swelling.  Does admit to a 20 pound weight loss over the past couple of months which was unintentional.  The patient is noted to have an elevated calcium at 12.5 in the ED.  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  Patient was admitted for dehydration, hypercalcemia and work-up of her likely ovarian malignancy. She underwent liver biopsy and currently awaiting results.   New events last 24 hours / Subjective: Continues to be mildly short of breath, no vomiting this morning and was able to tolerate breakfast.  She is passing gas and having loose stools, some epigastric abdominal soreness.  Denies any further back pain or right shoulder pain.  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  Repeat chest x-ray reveals right lobar pneumonia.  It is my belief that it is likely that pneumonia was present on admission but is more evident on chest x-ray with IV fluid resuscitation -Rocephin, azithromycin  -Will add breathing treatments today   Acute hypoxemic respiratory failure -Currently on 2L [place] O2  Elevated LFT -Continue to monitor LFT.  LFT slowly trending upward. Patient does have tumor burden in the liver noted on on MRI abdomen  HLD -Hold lipitor due to elevated LFTs   Low back pain -Continue supportive care.  May need further imaging to rule out bony metastatic disease.  Back pain improved  Essential hypertension -Resume amlodipine. Hold ACE due to AKI on presentation.  Blood pressure stable 119/99  AKI  -Baseline Cr 1  -Remains stable   DVT prophylaxis: Lovenox Code Status: Full Family Communication: Spoke with niece over the phone 6/8. Tried to call niece again today but she did not pick up and voicemail box not available.  Patient is unmarried and without biological children.  Patient has named her niece Olivia Mackie as healthcare power of attorney but no formal paperwork filed yet  Disposition Plan: Pending further work up, pathology report, improvement in nutrition and ambulation   Consultants:   GYN oncology  Procedures:   Liver biopsy  Antimicrobials:  Anti-infectives (From admission, onward)   Start     Dose/Rate  Route Frequency Ordered Stop   04/16/19 2000  azithromycin (ZITHROMAX) 500 mg in sodium chloride 0.9 % 250 mL IVPB     500 mg 250 mL/hr over 60 Minutes Intravenous Every 24 hours 04/16/19 1737     04/16/19 1800  cefTRIAXone (ROCEPHIN) 1 g in sodium chloride 0.9 % 100 mL IVPB     1 g 200 mL/hr over 30 Minutes Intravenous Every 24 hours 04/16/19 1737         Objective: Vitals:   04/17/19 1500 04/17/19 2201 04/18/19 0659 04/18/19 0926  BP: (!) 156/62 (!) 119/99    Pulse: 99 99    Resp: 17 18    Temp: 98.7 F (37.1 C) 97.8 F (36.6 C)    TempSrc: Oral Oral    SpO2: 95% 94%  93%  Weight:   122.5 kg   Height:        Intake/Output Summary (Last 24 hours) at 04/18/2019 1107 Last data filed at 04/18/2019 1100 Gross per 24 hour  Intake 830.02 ml  Output 500 ml  Net 330.02 ml   Filed Weights   04/16/19 0611 04/17/19 0552 04/18/19 0659  Weight: 123.3 kg 122.5 kg 122.5 kg    Examination: General exam: Appears calm and comfortable, appears very weak and fatigued Respiratory system: Clear to auscultation. Respiratory effort slightly increased Cardiovascular system: S1 & S2 heard, tachycardic rate, regular rhythm. No JVD, murmurs, rubs, gallops or clicks. No pedal edema. Gastrointestinal system: Abdomen is nondistended, soft and mildly tender to palpation epigastric, hypoactive bowel sounds.  No sign of acute abdomen Central nervous system: Alert and oriented. No focal neurological deficits. Extremities: Symmetric 5 x 5 power. Skin: No rashes, lesions or ulcers Psychiatry: Judgement and insight appear normal. Mood & affect appropriate.    Data Reviewed: I have personally reviewed following labs and imaging studies  CBC: Recent Labs  Lab 04/14/19 0513 04/15/19 0513 04/16/19 0431 04/17/19 0428 04/18/19 0608  WBC 19.5* 19.0* 18.3* 18.0* 22.4*  HGB 11.9* 10.9* 10.7* 11.0* 10.8*  HCT 38.3 34.6* 33.3* 34.8* 33.1*  MCV 88.2 87.2 86.5 88.3 83.6  PLT 384 356 334 333 401   Basic  Metabolic Panel: Recent Labs  Lab 04/14/19 0513 04/15/19 0513 04/16/19 0431 04/17/19 0428 04/18/19 0608 04/18/19 0738  NA 138 135 134* 135 135  --   K 3.9 3.8 3.9 4.3 4.7  --   CL 110 107 105 105 107  --   CO2 18* 19* 20* 18* 16*  --   GLUCOSE 82 117* 100* 91 80  --   BUN 21 27* 31* 35* 36*  --   CREATININE 0.89 1.02* 1.07* 1.08* 1.10*  --   CALCIUM 9.9 9.4 9.2 8.9 8.6*  --   MG  --   --   --   --   --  2.8*   GFR: Estimated Creatinine Clearance: 65 mL/min (A) (by C-G formula based on SCr of 1.1 mg/dL (H)). Liver Function Tests: Recent Labs  Lab 04/14/19 0513 04/15/19 0513 04/16/19 0431 04/17/19 0428 04/18/19 0608  AST 194* 253* 324* 411* 430*  ALT 74*  95* 120* 139* 138*  ALKPHOS 409* 651* 794* 828* 907*  BILITOT 1.8* 2.0* 2.1* 2.1* 2.0*  PROT 6.6 6.5 6.4* 6.3* 6.4*  ALBUMIN 2.9* 2.9* 2.8* 2.5* 2.4*   Recent Labs  Lab 04/11/19 1621  LIPASE 39   No results for input(s): AMMONIA in the last 168 hours. Coagulation Profile: Recent Labs  Lab 04/13/19 0957  INR 1.3*   Cardiac Enzymes: Recent Labs  Lab 04/11/19 1621  TROPONINI 0.03*   BNP (last 3 results) No results for input(s): PROBNP in the last 8760 hours. HbA1C: No results for input(s): HGBA1C in the last 72 hours. CBG: No results for input(s): GLUCAP in the last 168 hours. Lipid Profile: No results for input(s): CHOL, HDL, LDLCALC, TRIG, CHOLHDL, LDLDIRECT in the last 72 hours. Thyroid Function Tests: No results for input(s): TSH, T4TOTAL, FREET4, T3FREE, THYROIDAB in the last 72 hours. Anemia Panel: No results for input(s): VITAMINB12, FOLATE, FERRITIN, TIBC, IRON, RETICCTPCT in the last 72 hours. Sepsis Labs: No results for input(s): PROCALCITON, LATICACIDVEN in the last 168 hours.  Recent Results (from the past 240 hour(s))  SARS Coronavirus 2 (CEPHEID - Performed in [place] hospital lab), Hosp Order     Status: None   Collection Time: 04/11/19  8:56 PM  Result Value Ref Range Status    SARS Coronavirus 2 NEGATIVE NEGATIVE Final    Comment: (NOTE) If result is NEGATIVE SARS-CoV-2 target nucleic acids are NOT DETECTED. The SARS-CoV-2 RNA is generally detectable in upper and lower  respiratory specimens during the acute phase of infection. The lowest  concentration of SARS-CoV-2 viral copies this assay can detect is 250  copies / mL. A negative result does not preclude SARS-CoV-2 infection  and should not be used as the sole basis for treatment or other  patient management decisions.  A negative result may occur with  improper specimen collection / handling, submission of specimen other  than nasopharyngeal swab, presence of viral mutation(s) within the  areas targeted by this assay, and inadequate number of viral copies  (<250 copies / mL). A negative result must be combined with clinical  observations, patient history, and epidemiological information. If result is POSITIVE SARS-CoV-2 target nucleic acids are DETECTED. The SARS-CoV-2 RNA is generally detectable in upper and lower  respiratory specimens dur ing the acute phase of infection.  Positive  results are indicative of active infection with SARS-CoV-2.  Clinical  correlation with patient history and other diagnostic information is  necessary to determine patient infection status.  Positive results do  not rule out bacterial infection or co-infection with other viruses. If result is PRESUMPTIVE POSTIVE SARS-CoV-2 nucleic acids MAY BE PRESENT.   A presumptive positive result was obtained on the submitted specimen  and confirmed on repeat testing.  While 2019 novel coronavirus  (SARS-CoV-2) nucleic acids may be present in the submitted sample  additional confirmatory testing may be necessary for epidemiological  and / or clinical management purposes  to differentiate between  SARS-CoV-2 and other Sarbecovirus currently known to infect humans.  If clinically indicated additional testing with an alternate test   methodology [phone]) is advised. The SARS-CoV-2 RNA is generally  detectable in upper and lower respiratory sp ecimens during the acute  phase of infection. The expected result is Negative. Fact Sheet for Patients:  StrictlyIdeas.no Fact Sheet for Healthcare Providers: BankingDealers.co.za This test is not yet approved or cleared by the [place] FDA and has been authorized for detection and/or diagnosis of SARS-CoV-2 by FDA under  an Emergency Use Authorization (EUA).  This EUA will remain in effect (meaning this test can be used) for the duration of the COVID-19 declaration under Section 564(b)(1) of the Act, 21 U.S.C. section 360bbb-3(b)(1), unless the authorization is terminated or revoked sooner. Performed at [facility], [address] [place], [place] [address]        Radiology Studies: No results found.    Scheduled Meds: . amLODipine  10 mg Oral Daily  . enoxaparin (LOVENOX) injection  40 mg Subcutaneous QHS  . feeding supplement  1 Container Oral BID BM  . feeding supplement (PRO-STAT SUGAR FREE 64)  30 mL Oral Daily  . ipratropium-albuterol  3 mL Nebulization Q6H  . multivitamin with minerals  1 tablet Oral Daily  . ondansetron (ZOFRAN) IV  4 mg Intravenous Q6H  . promethazine  6.25 mg Intravenous Q6H  . sodium chloride flush  3 mL Intravenous Q12H   Continuous Infusions: . azithromycin 500 mg (04/17/19 2020)  . cefTRIAXone (ROCEPHIN)  IV 1 g (04/17/19 1833)     LOS: 7 days    Time spent: 25 minutes   Dessa Phi, DO [facility] [email or web address] 04/18/2019, 11:07 AM

## 2019-04-19 DIAGNOSIS — R160 Hepatomegaly, not elsewhere classified: Secondary | ICD-10-CM

## 2019-04-19 LAB — BASIC METABOLIC PANEL
Anion gap: 12 (ref 5–15)
BUN: 36 mg/dL — ABNORMAL HIGH (ref 8–23)
CO2: 17 mmol/L — ABNORMAL LOW (ref 22–32)
Calcium: 8.3 mg/dL — ABNORMAL LOW (ref 8.9–10.3)
Chloride: 106 mmol/L (ref 98–111)
Creatinine, Ser: 1.12 mg/dL — ABNORMAL HIGH (ref 0.44–1.00)
GFR calc Af Amer: 59 mL/min — ABNORMAL LOW (ref >60)
GFR calc non Af Amer: 51 mL/min — ABNORMAL LOW (ref >60)
Glucose, Bld: 84 mg/dL (ref 70–99)
Potassium: 4.7 mmol/L (ref 3.5–5.1)
Sodium: 135 mmol/L (ref 135–145)

## 2019-04-19 LAB — HEPATIC FUNCTION PANEL
ALT: 149 U/L — ABNORMAL HIGH (ref 0–44)
AST: 441 U/L — ABNORMAL HIGH (ref 15–41)
Albumin: 2.6 g/dL — ABNORMAL LOW (ref 3.5–5.0)
Alkaline Phosphatase: 941 U/L — ABNORMAL HIGH (ref 38–126)
Bilirubin, Direct: 1.8 mg/dL — ABNORMAL HIGH (ref 0.0–0.2)
Indirect Bilirubin: 1.1 mg/dL — ABNORMAL HIGH (ref 0.3–0.9)
Total Bilirubin: 2.9 mg/dL — ABNORMAL HIGH (ref 0.3–1.2)
Total Protein: 6.5 g/dL (ref 6.5–8.1)

## 2019-04-19 LAB — CBC
HCT: 33.6 % — ABNORMAL LOW (ref 36.0–46.0)
Hemoglobin: 11.1 g/dL — ABNORMAL LOW (ref 12.0–15.0)
MCH: 27.6 pg (ref 26.0–34.0)
MCHC: 33 g/dL (ref 30.0–36.0)
MCV: 83.6 fL (ref 80.0–100.0)
Platelets: 354 10*3/uL (ref 150–400)
RBC: 4.02 MIL/uL (ref 3.87–5.11)
RDW: 15 % (ref 11.5–15.5)
WBC: 22.4 10*3/uL — ABNORMAL HIGH (ref 4.0–10.5)
nRBC: 2.5 % — ABNORMAL HIGH (ref 0.0–0.2)

## 2019-04-19 LAB — MAGNESIUM: Magnesium: 3 mg/dL — ABNORMAL HIGH (ref 1.7–2.4)

## 2019-04-19 MED ORDER — ENSURE ENLIVE PO LIQD: 237.0000 mL | Freq: Two times a day (BID) | ORAL | Status: DC

## 2019-04-19 NOTE — Consult Note (Signed)
Gyn Onc Note Liver Biopsy confirmed carcinoma but immunostains non-diagnostic and not consistent with ovarian cancer primary (negative for CK-7, ER, PAX-8). Additionally, the disease distribution (heavy burden intraparenchymal liver and pulmonary, without peritoneal disease) is not consistent with a primary ovarian carcinoma.  I recommend discussion with the on-call medical oncologist regarding how to proceed with treatment of this disease. It is not a disease for which a surgical intervention is appropriate, regardless of the primary, and needs chemotherapy in order to attempt some therapeutic effect.  Thereasa Solo, MD Cell [phone]

## 2019-04-19 NOTE — Progress Notes (Signed)
I was asked to comment about the results of the pathology from a liver biopsy in the setting of metastatic disease.  Although the pathology does not appear to be specific for GYN etiology no clear-cut primary has been otherwise identified based on imaging studies.  Her MRI does indicate a right adnexal mass that is suspicious for primary ovarian malignancy.  Based on these findings, I will arrange follow-up for her with Dr. Alvy Bimler as an outpatient.  No additional work-up or evaluation is needed inpatient at this time.

## 2019-04-19 NOTE — Progress Notes (Signed)
Nutrition Follow-up  RD working remotely.   DOCUMENTATION CODES:   Morbid obesity  INTERVENTION:  - will d/c Boost Breeze and prostat. - will order Ensure Enlive BID, each supplement provides 350 kcal and 20 grams of protein. - continue to encourage PO intakes.    NUTRITION DIAGNOSIS:   Inadequate oral intake related to acute illness, nausea, vomiting, poor appetite as evidenced by per patient/family report. -ongoing  GOAL:   Patient will meet greater than or equal to 90% of their needs -unmet at this time  MONITOR:   PO intake, Supplement acceptance, Labs, Weight trends  ASSESSMENT:   68 y.o. female with past medical history significant for complex ovarian cyst. She presented to the ED d/t worsening weakness and N/V which began 8 days PTA. She was seen at an outside ED last week where she was noted to have a mildly elevated calcium (11.5 mg/dl) and was treated with IV hydration and Zofran with temporary improvement.  Patient was discharged home on Zofran however the nausea has worsened. In the ED her Ca was 12.5 mg/dl. 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 Patient admitted for dehydration, hypercalcemia, and work-up of her likely ovarian malignancy.  Current weight is +7.7 kg/17 lb since admission (6/2). Estimated nutrition needs from last week remain appropriate. Patient was on CLD on 6/3 and then advanced to Regular diet on 6/4. Since that time she has often been skipping/refusing meals. Boost Breeze is ordered BID and prostat is ordered once/day and patient has refused nearly all of both of these supplements.   Per notes: concern for ovarian cancer with mets to liver, and lung--CT chest showed diffuse metastatic disease, MR abdomen showed masses consistent with hepatic metastatic disease and numerous pulmonary nodules compatible with pulmonary metastatic disease; liver biopsy 6/4 with results pending; sepsis 2/2 CAP; low back pain with possible need  for further imaging to r/o bone mets.   Medications reviewed; daily multivitamin with minerals. Labs reviewed; BUN: 36 mg/dl, creatinine: 1.12 mg/dl, Ca: 8.3 mg/dl, Mg: 3 mg/dl, LFTs elevated, GFR: 51 ml/min.     NUTRITION - FOCUSED PHYSICAL EXAM:  unable to complete at this time.   Diet Order:   Diet Order            Diet regular Room service appropriate? Yes; Fluid consistency: Thin  Diet effective now              EDUCATION NEEDS:   Not appropriate for education at this time  Skin:  Skin Assessment: Reviewed RN Assessment  Last BM:  6/9  Height:   Ht Readings from Last 1 Encounters:  04/12/19 5\' 4"  (1.626 m)    Weight:   Wt Readings from Last 1 Encounters:  04/19/19 124.3 kg    Ideal Body Weight:  54.5 kg  BMI:  Body mass index is 47.04 kg/m.  Estimated Nutritional Needs:   Kcal:  1880-2110 kcal  Protein:  95-110 grams  Fluid:  >/= 2.5 L/day     Jarome Matin, MS, RD, LDN, [facility] Inpatient Clinical Dietitian Pager # [phone] After hours/weekend pager # [phone]

## 2019-04-19 NOTE — Progress Notes (Signed)
PROGRESS NOTE    Sandra Richardson  SWH:[id number] DOB: 1951-08-22 DOA: 04/11/2019 PCP: McLean-Scocuzza, Nino Glow, MD     Brief Narrative:  Sandra Richardson is a 68 y.o. female with past medical history significant for complex ovarian cyst who was in her usual state of health until about 8 days ago when she started develop weakness, nausea and vomiting.  She was unable to tolerate much food because of anorexia and the nausea.  She was seen at an outside ED last week where she was noted to have a mildly elevated calcium at 11.5 and was treated with IV hydration and Zofran with temporary improvement.  Patient was discharged home on Zofran however the nausea has worsened and is not being able to be controlled on the Zofran alone.  Patient now comes in for management of weakness nausea and vomiting. She denies fevers or chills. She denies any blood in her vomitus.  Denies any abdominal pain or swelling.  Does admit to a 20 pound weight loss over the past couple of months which was unintentional.  The patient is noted to have an elevated calcium at 12.5 in the ED.  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  Patient was admitted for dehydration, hypercalcemia and work-up of her likely ovarian malignancy. She underwent liver biopsy and currently awaiting results.   New events last 24 hours / Subjective: Patient reported feeling weak and fatigued, denies any new complaints.  Assessment & Plan:   Active Problems:   Hypercalcemia of malignancy   Nausea and vomiting   Ovarian mass   Multiple lung nodules on CT   Liver masses   Hypercalcemia likely 2/2 malignancy Improved -Received Zometa x 1 -PTH normal, 51 -Vit D level normal, 54  -PTHrP normal, < 2.0  Concern for ovarian cancer with metastases to lung, liver  -CT chest: revealed diffuse metastatic disease -MR abdomen: patchy confluence enhancing masses, compatible with infiltrative hepatic metastatic disease,  numerous pulmonary nodules at lung bases compatible with pulmonary metastatic disease  -CEA within normal limit, 2.0 -CA 125 elevated, 142 -Liver biopsy 6/4 -pathology report showed maligant mets, unclear etiology -GYN oncology-does not think primary is the ovary, recommended medical oncology -Dr. Alen Blew consulted, reviewed the chart and will arrange outpatient follow-up   Sepsis secondary to community-acquired pneumonia, present on admission Afebrile with leukocytosis Repeat chest x-ray reveals right lobar pneumonia Continue Rocephin, azithromycin  Duo nebs  Acute hypoxemic respiratory failure -Currently on 2L [place] O2 Likely due to above  Elevated LFT -Continue to monitor LFT  HLD -Hold lipitor due to elevated LFTs   Essential hypertension Stable -Resume amlodipine. Hold ACE due to AKI on presentation  AKI  -Baseline Cr 1  -Remains stable   DVT prophylaxis: Lovenox Code Status: Full Family Communication: None at bedside. Patient is unmarried and without biological children.  Patient has named her niece Olivia Mackie as healthcare power of attorney but no formal paperwork filed yet  Disposition Plan: Likely home once work up complete and patient stable   Consultants:   GYN oncology  Medical oncology  Procedures:   Liver biopsy  Antimicrobials:  Anti-infectives (From admission, onward)   Start     Dose/Rate Route Frequency Ordered Stop   04/16/19 2000  azithromycin (ZITHROMAX) 500 mg in sodium chloride 0.9 % 250 mL IVPB     500 mg 250 mL/hr over 60 Minutes Intravenous Every 24 hours 04/16/19 1737     04/16/19 1800  cefTRIAXone (  ROCEPHIN) 1 g in sodium chloride 0.9 % 100 mL IVPB     1 g 200 mL/hr over 30 Minutes Intravenous Every 24 hours 04/16/19 1737         Objective: Vitals:   04/19/19 0543 04/19/19 0747 04/19/19 1552 04/19/19 1700  BP: (!) 134/54   128/61  Pulse: 94 98  99  Resp: (!) 21 17  20   Temp: 99 F (37.2 C)   98.2 F (36.8 C)  TempSrc:    Oral   SpO2: 94% 92% 95% 93%  Weight: 124.3 kg     Height:        Intake/Output Summary (Last 24 hours) at 04/19/2019 1926 Last data filed at 04/19/2019 1747 Gross per 24 hour  Intake 400.03 ml  Output 300 ml  Net 100.03 ml   Filed Weights   04/17/19 0552 04/18/19 0659 04/19/19 0543  Weight: 122.5 kg 122.5 kg 124.3 kg    Examination:  General: NAD   Cardiovascular: S1, S2 present  Respiratory: CTAB  Abdomen: Soft, mild generalized tenderness, nondistended, bowel sounds present  Musculoskeletal: No bilateral pedal edema noted  Skin: Normal  Psychiatry: Normal mood    Data Reviewed: I have personally reviewed following labs and imaging studies  CBC: Recent Labs  Lab 04/15/19 0513 04/16/19 0431 04/17/19 0428 04/18/19 0608 04/19/19 0529  WBC 19.0* 18.3* 18.0* 22.4* 22.4*  HGB 10.9* 10.7* 11.0* 10.8* 11.1*  HCT 34.6* 33.3* 34.8* 33.1* 33.6*  MCV 87.2 86.5 88.3 83.6 83.6  PLT 356 334 333 345 182   Basic Metabolic Panel: Recent Labs  Lab 04/15/19 0513 04/16/19 0431 04/17/19 0428 04/18/19 0608 04/18/19 0738 04/19/19 0529  NA 135 134* 135 135  --  135  K 3.8 3.9 4.3 4.7  --  4.7  CL 107 105 105 107  --  106  CO2 19* 20* 18* 16*  --  17*  GLUCOSE 117* 100* 91 80  --  84  BUN 27* 31* 35* 36*  --  36*  CREATININE 1.02* 1.07* 1.08* 1.10*  --  1.12*  CALCIUM 9.4 9.2 8.9 8.6*  --  8.3*  MG  --   --   --   --  2.8* 3.0*   GFR: Estimated Creatinine Clearance: 63.5 mL/min (A) (by C-G formula based on SCr of 1.12 mg/dL (H)). Liver Function Tests: Recent Labs  Lab 04/15/19 0513 04/16/19 0431 04/17/19 0428 04/18/19 0608 04/19/19 0529  AST 253* 324* 411* 430* 441*  ALT 95* 120* 139* 138* 149*  ALKPHOS 651* 794* 828* 907* 941*  BILITOT 2.0* 2.1* 2.1* 2.0* 2.9*  PROT 6.5 6.4* 6.3* 6.4* 6.5  ALBUMIN 2.9* 2.8* 2.5* 2.4* 2.6*   No results for input(s): LIPASE, AMYLASE in the last 168 hours. No results for input(s): AMMONIA in the last 168 hours. Coagulation  Profile: Recent Labs  Lab 04/13/19 0957  INR 1.3*   Cardiac Enzymes: No results for input(s): CKTOTAL, CKMB, CKMBINDEX, TROPONINI in the last 168 hours. BNP (last 3 results) No results for input(s): PROBNP in the last 8760 hours. HbA1C: No results for input(s): HGBA1C in the last 72 hours. CBG: No results for input(s): GLUCAP in the last 168 hours. Lipid Profile: No results for input(s): CHOL, HDL, LDLCALC, TRIG, CHOLHDL, LDLDIRECT in the last 72 hours. Thyroid Function Tests: No results for input(s): TSH, T4TOTAL, FREET4, T3FREE, THYROIDAB in the last 72 hours. Anemia Panel: No results for input(s): VITAMINB12, FOLATE, FERRITIN, TIBC, IRON, RETICCTPCT in the last 72 hours. Sepsis Labs:  No results for input(s): PROCALCITON, LATICACIDVEN in the last 168 hours.  Recent Results (from the past 240 hour(s))  SARS Coronavirus 2 (CEPHEID - Performed in [place] hospital lab), Hosp Order     Status: None   Collection Time: 04/11/19  8:56 PM  Result Value Ref Range Status   SARS Coronavirus 2 NEGATIVE NEGATIVE Final    Comment: (NOTE) If result is NEGATIVE SARS-CoV-2 target nucleic acids are NOT DETECTED. The SARS-CoV-2 RNA is generally detectable in upper and lower  respiratory specimens during the acute phase of infection. The lowest  concentration of SARS-CoV-2 viral copies this assay can detect is 250  copies / mL. A negative result does not preclude SARS-CoV-2 infection  and should not be used as the sole basis for treatment or other  patient management decisions.  A negative result may occur with  improper specimen collection / handling, submission of specimen other  than nasopharyngeal swab, presence of viral mutation(s) within the  areas targeted by this assay, and inadequate number of viral copies  (<250 copies / mL). A negative result must be combined with clinical  observations, patient history, and epidemiological information. If result is POSITIVE SARS-CoV-2 target  nucleic acids are DETECTED. The SARS-CoV-2 RNA is generally detectable in upper and lower  respiratory specimens dur ing the acute phase of infection.  Positive  results are indicative of active infection with SARS-CoV-2.  Clinical  correlation with patient history and other diagnostic information is  necessary to determine patient infection status.  Positive results do  not rule out bacterial infection or co-infection with other viruses. If result is PRESUMPTIVE POSTIVE SARS-CoV-2 nucleic acids MAY BE PRESENT.   A presumptive positive result was obtained on the submitted specimen  and confirmed on repeat testing.  While 2019 novel coronavirus  (SARS-CoV-2) nucleic acids may be present in the submitted sample  additional confirmatory testing may be necessary for epidemiological  and / or clinical management purposes  to differentiate between  SARS-CoV-2 and other Sarbecovirus currently known to infect humans.  If clinically indicated additional testing with an alternate test  methodology [phone]) is advised. The SARS-CoV-2 RNA is generally  detectable in upper and lower respiratory sp ecimens during the acute  phase of infection. The expected result is Negative. Fact Sheet for Patients:  StrictlyIdeas.no Fact Sheet for Healthcare Providers: BankingDealers.co.za This test is not yet approved or cleared by the [place] FDA and has been authorized for detection and/or diagnosis of SARS-CoV-2 by FDA under an Emergency Use Authorization (EUA).  This EUA will remain in effect (meaning this test can be used) for the duration of the COVID-19 declaration under Section 564(b)(1) of the Act, 21 U.S.C. section 360bbb-3(b)(1), unless the authorization is terminated or revoked sooner. Performed at [facility], [address], [place], [place] [address]        Radiology Studies: No results found.    Scheduled Meds: . amLODipine  10 mg  Oral Daily  . enoxaparin (LOVENOX) injection  40 mg Subcutaneous QHS  . feeding supplement (ENSURE ENLIVE)  237 mL Oral BID BM  . ipratropium-albuterol  3 mL Nebulization TID  . multivitamin with minerals  1 tablet Oral Daily  . ondansetron (ZOFRAN) IV  4 mg Intravenous Q6H  . promethazine  6.25 mg Intravenous Q6H  . sodium chloride flush  3 mL Intravenous Q12H   Continuous Infusions: . azithromycin 500 mg (04/18/19 2129)  . cefTRIAXone (ROCEPHIN)  IV 1 g (04/19/19 1719)  LOS: 8 days    Time spent: 25 minutes   Alma Friendly, MD [facility] [email or web address] 04/19/2019, 7:26 PM

## 2019-04-19 NOTE — Progress Notes (Signed)
Physical Therapy Treatment Patient Details Name: LUNDON ROSIER MRN: [id number] DOB: [birth year] Today's Date: 04/19/2019    History of Present Illness NADIE FIUMARA is an 68 y.o. female with past medical history significant for complex ovarian cyst who was in her usual state of health until about 8 days ago when she started develop weakness, nausea and vomiting. 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  Patient is being admitted for dehydration, hypercalcemia and work-up of her likely ovarian malignancy    PT Comments    Pt limited by weakness and fatigue.  Trial without O2 A and O2 saturation reduced to 88% during 20 ft ambulation.  2L O2 A via nasal cannula with reduce of saturation to 95-96%.  Seated and supine exercises complete to assist with LE strengthening.  EOS pt wished to return to bed.  Call bell within reach and bed alarm set.  No reports of increased pain.  RN aware of status.       Follow Up Recommendations  Home health PT;Supervision for mobility/OOB     Equipment Recommendations  Rolling walker with 5" wheels(Pt reports she can borrow one)    Recommendations for Other Services       Precautions / Restrictions Precautions Precautions: Fall Precaution Comments: monitor sats Restrictions Weight Bearing Restrictions: No    Mobility  Bed Mobility Overal bed mobility: Modified Independent             General bed mobility comments: use of bed rails, increased time  Transfers Overall transfer level: Needs assistance Equipment used: None Transfers: Sit to/from Stand Sit to Stand: Min assist         General transfer comment: light assistance to stand   Ambulation/Gait Ambulation/Gait assistance: Min guard Gait Distance (Feet): 20 Feet Assistive device: IV Pole Gait Pattern/deviations: Step-through pattern;Decreased stride length     General Gait Details: Initially ambualted pushing IV pole around room without O2,  decreased O2 saturation to 88%.  Able to keep O2 saturation at 95-96% with 2L O2 A.   Stairs             Wheelchair Mobility    Modified Rankin (Stroke Patients Only)       Arboriculturist Exercises - Lower Extremity Long Arc Quad: Both;10 reps;Seated Hip Flexion/Marching: Both;10 reps;Supine Toe Raises: Both;10 reps;Seated Heel Raises: Both;10 reps;Seated    General Comments        Pertinent Vitals/Pain Pain Assessment: 0-10 Pain Score: 1  Pain Location: center of breasts Pain Descriptors / Indicators: Aching;Sore Pain Intervention(s): Monitored during session    Home Living                      Prior Function            PT Goals (current goals can now be found in the care plan section)      Frequency    Min 3X/week  PT Plan Current plan remains appropriate    Co-evaluation              AM-PAC PT "6 Clicks" Mobility   Outcome Measure  Help needed turning from your back to your side while in a flat bed without using bedrails?: None Help needed moving from lying on your back to sitting on the side of a flat bed without using bedrails?: None Help needed moving to and from a bed to a chair (including a wheelchair)?: A Little Help needed standing up from a chair using your arms (e.g., wheelchair or bedside chair)?: A Little Help needed to walk in hospital room?: A Little Help needed climbing 3-5 steps with a railing? : A Little 6 Click Score: 20    End of Session Equipment Utilized During Treatment: Oxygen;Gait belt Activity Tolerance: Patient tolerated treatment well Patient left: in bed;with call bell/phone within reach;with bed alarm set Nurse Communication: Mobility status PT Visit Diagnosis: Difficulty in walking, not elsewhere classified (R26.2)     Time: 1530-1550 PT  Time Calculation (min) (ACUTE ONLY): 20 min  Charges:  $Gait Training: 8-22 mins $Therapeutic Exercise: 8-22 mins                     [address], LPTA; [place]  Aldona Lento 04/19/2019, 3:57 PM

## 2019-04-19 NOTE — Plan of Care (Signed)
Pt still very weak and needs assistance to and from [facility]. Pt became nauseated after eating ice chips. Antiemetic given.

## 2019-04-19 NOTE — Progress Notes (Signed)
Occupational Therapy Treatment Patient Details Name: Sandra Richardson MRN: [id number] DOB: 1951/08/16 Today's Date: 04/19/2019    History of present illness Sandra Richardson is an 68 y.o. female with past medical history significant for complex ovarian cyst who was in her usual state of health until about 8 days ago when she started develop weakness, nausea and vomiting. 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  Patient is being admitted for dehydration, hypercalcemia and work-up of her likely ovarian malignancy   OT comments  Pt able to sit EOB for ADL, use 3:1 commode and get up to chair.  Pt feels weak:  Mostly min A needed  Follow Up Recommendations  Home health OT;Supervision/Assistance - 24 hour    Equipment Recommendations  None recommended by OT    Recommendations for Other Services      Precautions / Restrictions Precautions Precautions: Fall Precaution Comments: monitor sats Restrictions Weight Bearing Restrictions: No       Mobility Bed Mobility        supervision for OOB, HOB raised and use of rail          Transfers   Equipment used: None   Sit to Stand: Min assist         General transfer comment: light assistance to stand     Balance                                           ADL either performed or assessed with clinical judgement   ADL       Grooming: Set up;Sitting;Oral care;Wash/dry face   Upper Body Bathing: Set up;Sitting   Lower Body Bathing: Minimal assistance;Sit to/from stand;Sitting/lateral leans   Upper Body Dressing : Minimal assistance;Sitting(due to lines)       Toilet Transfer: Minimal assistance;Stand-pivot;BSC;RW Toilet Transfer Details (indicate cue type and reason): steadying support Toileting- Clothing Manipulation and Hygiene: Minimal assistance Toileting - Clothing Manipulation Details (indicate cue type and reason): for thoroughness       General ADL  Comments: tolerated well; pt did dry heave while brushing teeth, which was her last activity     Vision       Perception     Praxis      Cognition Arousal/Alertness: Awake/alert Behavior During Therapy: WFL for tasks assessed/performed Overall Cognitive Status: Within Functional Limits for tasks assessed                                          Exercises     Shoulder Instructions       General Comments      Pertinent Vitals/ Pain       Pain Assessment: No/denies pain  Home Living                                          Prior Functioning/Environment              Frequency  Min 2X/week        Progress Toward Goals  OT Goals(current goals can now be found in the care plan section)  Progress towards OT goals: Progressing toward goals  Plan      Co-evaluation                 AM-PAC OT "6 Clicks" Daily Activity     Outcome Measure   Help from another person eating meals?: None Help from another person taking care of personal grooming?: A Little Help from another person toileting, which includes using toliet, bedpan, or urinal?: A Little Help from another person bathing (including washing, rinsing, drying)?: A Little Help from another person to put on and taking off regular upper body clothing?: A Little Help from another person to put on and taking off regular lower body clothing?: A Little 6 Click Score: 19    End of Session    OT Visit Diagnosis: Muscle weakness (generalized) (M62.81)   Activity Tolerance Patient tolerated treatment well   Patient Left in chair;with call bell/phone within reach   Nurse Communication Mobility status        Time: 4967-5916 OT Time Calculation (min): 26 min  Charges: OT General Charges $OT Visit: 1 Visit OT Treatments $Self Care/Home Management : 23-37 mins  Lesle Chris, OTR/L Acute Rehabilitation Services [phone] [facility] pager [phone]  office 04/19/2019   [place] 04/19/2019, 10:34 AM

## 2019-04-20 ENCOUNTER — Telehealth: Payer: Self-pay | Admitting: Internal Medicine

## 2019-04-20 ENCOUNTER — Inpatient Hospital Stay (HOSPITAL_COMMUNITY): Payer: Managed Care, Other (non HMO)

## 2019-04-20 DIAGNOSIS — Z7189 Other specified counseling: Secondary | ICD-10-CM

## 2019-04-20 DIAGNOSIS — Z515 Encounter for palliative care: Secondary | ICD-10-CM

## 2019-04-20 DIAGNOSIS — Z8051 Family history of malignant neoplasm of kidney: Secondary | ICD-10-CM

## 2019-04-20 DIAGNOSIS — Z8042 Family history of malignant neoplasm of prostate: Secondary | ICD-10-CM

## 2019-04-20 DIAGNOSIS — C787 Secondary malignant neoplasm of liver and intrahepatic bile duct: Secondary | ICD-10-CM

## 2019-04-20 DIAGNOSIS — C801 Malignant (primary) neoplasm, unspecified: Secondary | ICD-10-CM

## 2019-04-20 DIAGNOSIS — C7802 Secondary malignant neoplasm of left lung: Secondary | ICD-10-CM

## 2019-04-20 DIAGNOSIS — Z888 Allergy status to other drugs, medicaments and biological substances status: Secondary | ICD-10-CM

## 2019-04-20 DIAGNOSIS — C7801 Secondary malignant neoplasm of right lung: Secondary | ICD-10-CM

## 2019-04-20 DIAGNOSIS — Z806 Family history of leukemia: Secondary | ICD-10-CM

## 2019-04-20 LAB — CBC WITH DIFFERENTIAL/PLATELET
Abs Immature Granulocytes: 2 10*3/uL — ABNORMAL HIGH (ref 0.00–0.07)
Basophils Absolute: 0.2 10*3/uL — ABNORMAL HIGH (ref 0.0–0.1)
Basophils Relative: 1 %
Eosinophils Absolute: 0.1 10*3/uL (ref 0.0–0.5)
Eosinophils Relative: 0 %
HCT: 30.7 % — ABNORMAL LOW (ref 36.0–46.0)
Hemoglobin: 10.3 g/dL — ABNORMAL LOW (ref 12.0–15.0)
Immature Granulocytes: 9 %
Lymphocytes Relative: 10 %
Lymphs Abs: 2.1 10*3/uL (ref 0.7–4.0)
MCH: 28.3 pg (ref 26.0–34.0)
MCHC: 33.6 g/dL (ref 30.0–36.0)
MCV: 84.3 fL (ref 80.0–100.0)
Monocytes Absolute: 2.1 10*3/uL — ABNORMAL HIGH (ref 0.1–1.0)
Monocytes Relative: 10 %
Neutro Abs: 15.1 10*3/uL — ABNORMAL HIGH (ref 1.7–7.7)
Neutrophils Relative %: 70 %
Platelets: 385 10*3/uL (ref 150–400)
RBC: 3.64 MIL/uL — ABNORMAL LOW (ref 3.87–5.11)
RDW: 15.4 % (ref 11.5–15.5)
WBC: 21.5 10*3/uL — ABNORMAL HIGH (ref 4.0–10.5)
nRBC: 2.7 % — ABNORMAL HIGH (ref 0.0–0.2)

## 2019-04-20 LAB — COMPREHENSIVE METABOLIC PANEL
ALT: 141 U/L — ABNORMAL HIGH (ref 0–44)
AST: 442 U/L — ABNORMAL HIGH (ref 15–41)
Albumin: 2.3 g/dL — ABNORMAL LOW (ref 3.5–5.0)
Alkaline Phosphatase: 927 U/L — ABNORMAL HIGH (ref 38–126)
Anion gap: 14 (ref 5–15)
BUN: 38 mg/dL — ABNORMAL HIGH (ref 8–23)
CO2: 17 mmol/L — ABNORMAL LOW (ref 22–32)
Calcium: 7.9 mg/dL — ABNORMAL LOW (ref 8.9–10.3)
Chloride: 103 mmol/L (ref 98–111)
Creatinine, Ser: 1.13 mg/dL — ABNORMAL HIGH (ref 0.44–1.00)
GFR calc Af Amer: 58 mL/min — ABNORMAL LOW (ref >60)
GFR calc non Af Amer: 50 mL/min — ABNORMAL LOW (ref >60)
Glucose, Bld: 81 mg/dL (ref 70–99)
Potassium: 4.9 mmol/L (ref 3.5–5.1)
Sodium: 134 mmol/L — ABNORMAL LOW (ref 135–145)
Total Bilirubin: 3 mg/dL — ABNORMAL HIGH (ref 0.3–1.2)
Total Protein: 6.2 g/dL — ABNORMAL LOW (ref 6.5–8.1)

## 2019-04-20 LAB — MAGNESIUM: Magnesium: 2.9 mg/dL — ABNORMAL HIGH (ref 1.7–2.4)

## 2019-04-20 MED ORDER — DEXAMETHASONE SODIUM PHOSPHATE 10 MG/ML IJ SOLN
10.0000 mg | Freq: Once | INTRAMUSCULAR | Status: AC
  Administered 2019-04-20: 12:00:00 10 mg via INTRAVENOUS
  Filled 2019-04-20: qty 1

## 2019-04-20 MED ORDER — ONDANSETRON 8 MG PO TBDP: 8.0000 mg | ORAL_TABLET | Freq: Three times a day (TID) | ORAL | 0 refills | Status: AC | PRN

## 2019-04-20 MED ORDER — DEXAMETHASONE 2 MG PO TABS
2.0000 mg | ORAL_TABLET | Freq: Two times a day (BID) | ORAL | Status: DC
  Filled 2019-04-20: qty 1

## 2019-04-20 MED ORDER — ONDANSETRON 4 MG PO TBDP: 8.0000 mg | ORAL_TABLET | Freq: Three times a day (TID) | ORAL | Status: DC | PRN

## 2019-04-20 MED ORDER — MORPHINE SULFATE (CONCENTRATE) 10 MG/0.5ML PO SOLN: 5.0000 mg | ORAL | 0 refills | Status: AC | PRN

## 2019-04-20 MED ORDER — MORPHINE SULFATE (CONCENTRATE) 10 MG/0.5ML PO SOLN: 5.0000 mg | ORAL | Status: DC | PRN

## 2019-04-20 MED ORDER — LORAZEPAM 1 MG PO TABS: 1.0000 mg | ORAL_TABLET | ORAL | 0 refills | Status: AC | PRN

## 2019-04-20 MED ORDER — DEXAMETHASONE 2 MG PO TABS: 2.0000 mg | ORAL_TABLET | Freq: Two times a day (BID) | ORAL | 0 refills | Status: AC

## 2019-04-20 MED ORDER — PROMETHAZINE HCL 25 MG RE SUPP: 12.5000 mg | Freq: Four times a day (QID) | RECTAL | Status: DC | PRN

## 2019-04-20 MED ORDER — LORAZEPAM 1 MG PO TABS: 1.0000 mg | ORAL_TABLET | ORAL | Status: DC | PRN

## 2019-04-20 MED ORDER — PROMETHAZINE HCL 12.5 MG RE SUPP: 12.5000 mg | Freq: Four times a day (QID) | RECTAL | 0 refills | Status: AC | PRN

## 2019-04-20 NOTE — Telephone Encounter (Signed)
Copied from [place] [id number]. Topic: Quick Communication - Home Health Verbal Orders >> Apr 20, 2019  1:52 PM Margot Ables wrote: Caller/Agency: Neoma Laming with [facility] Number: [phone] secure VM Requesting OT/PT/Skilled Nursing/Social Work/Speech Therapy: Hospice Frequency: pt possibly discharging from [facility] hospital today and hospice was recommended. Neoma Laming is requesting VO for hospice and to see if Dr. Terese Door will be the attending of record.

## 2019-04-20 NOTE — Telephone Encounter (Signed)
I will still be on board but typically let hospice doctors or cancer doctors put orders in and follow the patient   Call back ok with verbal orders PT/OT, SLP, SW  [place] Note   [phone]        04/20/19 1:54 PM Inocente Salles S routed this conversation to [place]     04/20/19 1:54 PM Unsigned Note   Copied from [place] [id number]. Topic: Quick Communication - Home Health Verbal Orders >> Apr 20, 2019  1:52 PM Margot Ables wrote: Caller/Agency: Neoma Laming with [place] Number: [phone] secure VM Requesting OT/PT/Skilled Nursing/Social Work/Speech Therapy: Hospice Frequency: pt possibly discharging from [facility] hospital today and hospice was recommended. Neoma Laming is requesting VO for hospice and to see if Dr. Terese Door will be the attending of record.

## 2019-04-20 NOTE — Progress Notes (Signed)
Spoke with pt concerning Home with Hospice. Pt selected [facility] for Home Hospice.  Referral called to in house rep Audrea Muscat, RN.

## 2019-04-20 NOTE — Progress Notes (Signed)
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, unknown primary but suspect possible GYN primary although final pathology was inconclusive Progressive liver failure I have a long discussion with the patient and her niece The patient have stage IV cancer, incurable If she desired palliative chemotherapy, single agent carboplatin can be given while she is hospitalized today in an attempt for salvage treatment Her prognosis is poor regardless due to her poor performance status and imminent liver failure We discussed briefly the risk, benefits, side effects of chemotherapy with carboplatin, including risk of pancytopenia, infection, nausea, weakness and risk of death Alternatively, if the patient does not want to pursue palliative chemotherapy, she will be a candidate for residential hospice She is interested to [place] with palliative care team and I have put in consult She is undecided Per patient request, I will return this afternoon for final decision  Metastatic cancer to the lungs She had no further cough or fever Per patient request, I have stopped IV antibiotics today Her elevated white blood cell count is likely due to untreated cancer  Goals of care discussion We discussed prognosis Without chemotherapy, I estimated her survival less than a month.  Without aggressive supportive care such as IV fluid hydration, etc., likely less than 2 weeks With chemotherapy, if she respond to treatment, I can introduce combination chemotherapy with Taxol If she continues to respond to treatment, she could potentially have 2 years or more We did not discuss CODE STATUS today because she is uncertain about chemotherapy I will return this afternoon for further discussion about goals of care  Discharge planning Unknown If she desires treatment, she can  receive chemotherapy today as inpatient and probably will need skilled nursing facility for rehab If she does not want treatment, residential hospice facility would be appropriate  I will come back to talk to her again this afternoon per patient request Please call if questions arise All questions were answered. The patient knows to call the clinic with any problems, questions or concerns. Heath Lark, MD 04/20/2019 8:52 AM  CHIEF COMPLAINTS/PURPOSE OF CONSULTATION:  Poorly differentiated metastatic cancer to the liver and lung, primary unknown, although GYN primary cannot be excluded in the presence of pelvic mass and elevated tumor marker  HISTORY OF PRESENTING ILLNESS:  Sandra Richardson 68 y.o. female is seen at the request by hospitalist to discuss treatment options with the patient I have also reviewed the case with GYN oncologist and pathologist prior to seeing the patient Her next of kin, her niece, Olivia Mackie was also available over the phone to discuss the case She has been complaining of severe nausea, vomiting, history of hematuria and flank pain approximately 4 months ago and underwent CT renal protocol which revealed a complex cystic mass in the pelvis. She was originally evaluated at [facility] with plan for surgical removal of the mass but it was delayed due to COVID-19. Subsequently, she was admitted to the hospital approximately 10 days ago for further evaluation and management of nausea, vomiting, abnormal liver enzymes and dehydration She was found to have malignant hypercalcemia and was treated accordingly Last week, she underwent CT-guided liver biopsy Pathology report as follows: Liver, needle/core biopsy, left lobe - METASTATIC CARCINOMA - SEE COMMENT Microscopic Comment The core biopsy show metastatic poorly differentiated carcinoma involving the liver parenchyma. By immunohistochemistry the neoplastic cells are positive for cytokeratin  AE1/3, cytokeratin 5/6, cytokeratin 8/18,  and has patchy positivity for CDX-2 and TTF-1. The neoplastic cells are negative for cytokeratin 7, cytokeratin 20, S100, WT-1, PAX-8, Napsin A, p63, and GATA-3. This immunoprofile is non-specific. A primary gynecologic carcinoma is not excluded; however, the immunophenotype is atypical.  She was started on broad-spectrum IV antibiotics 4 days ago for cough and presumed pneumonia She has been afebrile and complain of frequent loose stool secondary to antibiotic treatment She denies right upper quadrant pain but have persistent sensation of nausea She had no constipation  MEDICAL HISTORY:  Past Medical History:  Diagnosis Date  . Allergy   . Chicken pox   . Complication of anesthesia   . History of kidney stones   . Hyperlipidemia   . Hypertension   . Liver cyst   . Ovarian cyst    1979  . PONV (postoperative nausea and vomiting)   . UTI (urinary tract infection)     SURGICAL HISTORY: Past Surgical History:  Procedure Laterality Date  . ANKLE SURGERY Right    1987.  all metal removed  . GANGLION CYST EXCISION     70s/80s  . TOE SURGERY     R great toe late 1990s. wire in foot maybe  . TONSILLECTOMY AND ADENOIDECTOMY     1972    SOCIAL HISTORY: Social History   Socioeconomic History  . Marital status: Single    Spouse name: Not on file  . Number of children: Not on file  . Years of education: Not on file  . Highest education level: Not on file  Occupational History  . Occupation: Fish farm manager    Comment: tax office  Social Needs  . Financial resource strain: Not on file  . Food insecurity    Worry: Not on file    Inability: Not on file  . Transportation needs    Medical: Not on file    Non-medical: Not on file  Tobacco Use  . Smoking status: Never Smoker  . Smokeless tobacco: Never Used  Substance and Sexual Activity  . Alcohol use: Not Currently    Alcohol/week: 0.0 standard drinks  . Drug use: Not Currently  . Sexual activity: Not Currently   Lifestyle  . Physical activity    Days per week: Not on file    Minutes per session: Not on file  . Stress: Not on file  Relationships  . Social Herbalist on phone: Not on file    Gets together: Not on file    Attends religious service: Not on file    Active member of club or organization: Not on file    Attends meetings of clubs or organizations: Not on file    Relationship status: Not on file  . Intimate partner violence    Fear of current or ex partner: Not on file    Emotionally abused: Not on file    Physically abused: Not on file    Forced sexual activity: Not on file  Other Topics Concern  . Not on file  Social History Narrative   No kids    Owns guns    Wears seat belts    Safe in relationship    College ed    Government job     FAMILY HISTORY: Family History  Problem Relation Age of Onset  . Heart disease Mother   . Hypertension Mother   . Hyperthyroidism Mother   . Osteoporosis Mother   . Heart disease Father   .  COPD Father   . Kidney cancer Father   . Arthritis Sister   . Depression Sister   . Hypertension Sister   . Hypothyroidism Sister   . Hyperthyroidism Brother        s/p ablation   . Stroke Maternal Grandmother   . Pneumonia Maternal Grandfather   . Cancer Paternal Grandmother        leukemia  . Heart disease Paternal Grandfather   . Cancer Brother        prostate/bladder cancer  . Breast cancer Neg Hx     ALLERGIES:  is allergic to diclofenac sodium and seasonal ic [cholestatin].  MEDICATIONS:  Current Facility-Administered Medications  Medication Dose Route Frequency Provider Last Rate Last Dose  . acetaminophen (TYLENOL) tablet 500-1,000 mg  500-1,000 mg Oral Q6H PRN Vashti Hey, MD   500 mg at 04/15/19 0807  . albuterol (PROVENTIL) (2.5 MG/3ML) 0.083% nebulizer solution 2.5 mg  2.5 mg Nebulization Q6H PRN Dessa Phi, DO      . amLODipine (NORVASC) tablet 10 mg  10 mg Oral Daily Dessa Phi, DO   10  mg at 04/19/19 0941  . azithromycin (ZITHROMAX) 500 mg in sodium chloride 0.9 % 250 mL IVPB  500 mg Intravenous Q24H Dessa Phi, DO 250 mL/hr at 04/20/19 0000 500 mg at 04/20/19 0000  . cefTRIAXone (ROCEPHIN) 1 g in sodium chloride 0.9 % 100 mL IVPB  1 g Intravenous Q24H Dessa Phi, DO 200 mL/hr at 04/19/19 1719 1 g at 04/19/19 1719  . enoxaparin (LOVENOX) injection 40 mg  40 mg Subcutaneous QHS Bonnell Public Tublu, MD   40 mg at 04/20/19 0046  . feeding supplement (ENSURE ENLIVE) (ENSURE ENLIVE) liquid 237 mL  237 mL Oral BID BM Alma Friendly, MD      . fluticasone (FLONASE) 50 MCG/ACT nasal spray 2 spray  2 spray Each Nare PRN Dessa Phi, DO      . Glycerin (Adult) 2.1 g suppository 1 suppository  1 suppository Rectal Daily PRN Dessa Phi, DO      . guaiFENesin (MUCINEX) 12 hr tablet 600 mg  600 mg Oral BID PRN Dessa Phi, DO   600 mg at 04/15/19 1023  . hydrALAZINE (APRESOLINE) injection 5 mg  5 mg Intravenous Q4H PRN Dessa Phi, DO      . ipratropium-albuterol (DUONEB) 0.5-2.5 (3) MG/3ML nebulizer solution 3 mL  3 mL Nebulization TID Dessa Phi, DO   3 mL at 04/20/19 0737  . morphine 2 MG/ML injection 1 mg  1 mg Intravenous Q4H PRN Dessa Phi, DO   1 mg at 04/20/19 0047  . multivitamin with minerals tablet 1 tablet  1 tablet Oral Daily Dessa Phi, DO   1 tablet at 04/15/19 1023  . ondansetron (ZOFRAN) injection 4 mg  4 mg Intravenous Q6H Dessa Phi, DO   4 mg at 04/20/19 0626  . promethazine (PHENERGAN) injection 6.25 mg  6.25 mg Intravenous Q6H Dessa Phi, DO   6.25 mg at 04/20/19 0626  . senna-docusate (Senokot-S) tablet 1 tablet  1 tablet Oral QHS PRN Dessa Phi, DO      . simethicone [facility]) chewable tablet 80 mg  80 mg Oral Q6H PRN Dessa Phi, DO   80 mg at 04/16/19 0754  . sodium chloride flush (NS) 0.9 % injection 3 mL  3 mL Intravenous Q12H Bonnell Public Tublu, MD   3 mL at 04/19/19 2330    REVIEW OF SYSTEMS:    Constitutional: Denies fevers, chills or  abnormal night sweats Eyes: Denies blurriness of vision, double vision or watery eyes Ears, nose, mouth, throat, and face: Denies mucositis or sore throat Cardiovascular: Denies palpitation, chest discomfort or lower extremity swelling Skin: Denies abnormal skin rashes Lymphatics: Denies new lymphadenopathy or easy bruising Behavioral/Psych: Mood is stable, no new changes  All other systems were reviewed with the patient and are negative.  PHYSICAL EXAMINATION: ECOG PERFORMANCE STATUS: 2 - Symptomatic, <50% confined to bed  Vitals:   04/20/19 0454 04/20/19 0737  BP: 137/72   Pulse: 96   Resp:  16  Temp: (!) 97.4 F (36.3 C)   SpO2: 94%    Filed Weights   04/18/19 0659 04/19/19 0543 04/20/19 0500  Weight: 270 lb 1 oz (122.5 kg) 274 lb 0.5 oz (124.3 kg) 266 lb 12.1 oz (121 kg)    GENERAL:alert, no distress and comfortable.  She appears jaundiced SKIN: skin color, texture, turgor are normal, no rashes or significant lesions EYES: normal, conjunctiva are pink and non-injected, sclera clear OROPHARYNX:no exudate, no erythema and lips, buccal mucosa, and tongue normal  NECK: supple, thyroid normal size, non-tender, without nodularity LYMPH:  no palpable lymphadenopathy in the cervical, axillary or inguinal LUNGS: clear to auscultation and percussion with normal breathing effort HEART: regular rate & rhythm and no murmurs and no lower extremity edema ABDOMEN:abdomen soft, non-tender and normal bowel sounds Musculoskeletal:no cyanosis of digits and no clubbing  PSYCH: alert & oriented x 3 with fluent speech NEURO: no focal motor/sensory deficits  LABORATORY DATA:  I have reviewed the data as listed Lab Results  Component Value Date   WBC 21.5 (H) 04/20/2019   HGB 10.3 (L) 04/20/2019   HCT 30.7 (L) 04/20/2019   MCV 84.3 04/20/2019   PLT 385 04/20/2019   Recent Labs    04/18/19 0608 04/19/19 0529 04/20/19 0552  NA 135 135 134*  K  4.7 4.7 4.9  CL 107 106 103  CO2 16* 17* 17*  GLUCOSE 80 84 81  BUN 36* 36* 38*  CREATININE 1.10* 1.12* 1.13*  CALCIUM 8.6* 8.3* 7.9*  GFRNONAA 52* 51* 50*  GFRAA >60 59* 58*  PROT 6.4* 6.5 6.2*  ALBUMIN 2.4* 2.6* 2.3*  AST 430* 441* 442*  ALT 138* 149* 141*  ALKPHOS 907* 941* 927*  BILITOT 2.0* 2.9* 3.0*  BILIDIR 1.6* 1.8*  --   IBILI 0.4 1.1*  --     RADIOGRAPHIC STUDIES: I have personally reviewed the radiological images as listed and agreed with the findings in the report. Dg Chest 2 View  Result Date: 04/16/2019 CLINICAL DATA:  Respiratory failure EXAM: CHEST - 2 VIEW COMPARISON:  April 13, 2019 chest radiograph and chest CT April 12, 2019 FINDINGS: There is a fairly small pleural effusion on the right with right base atelectasis and airspace consolidation. Pulmonary nodular lesions are seen throughout the lungs, better delineated on recent CT. Heart size and pulmonary vascularity are normal. No adenopathy demonstrable. No bone lesions. IMPRESSION: Fairly small right pleural effusion with atelectasis and consolidation in the right base region. Suspect pneumonia in the right base. Small pulmonary nodular lesions throughout the lungs diffusely are noted, better seen on recent CT. Cardiac silhouette is within normal limits. No adenopathy is demonstrable by radiography. Electronically Signed   By: Lowella Grip III M.D.   On: 04/16/2019 15:13   Ct Chest W Contrast  Result Date: 04/12/2019 CLINICAL DATA:  Pelvic mass seen on recent CT abdomen/pelvis with suspected hepatic metastatic disease. Evaluate the chest for metastatic  disease. EXAM: CT CHEST WITH CONTRAST TECHNIQUE: Multidetector CT imaging of the chest was performed during intravenous contrast administration. CONTRAST:  46mL OMNIPAQUE IOHEXOL 300 MG/ML  SOLN COMPARISON:  CT abdomen/pelvis 04/11/2019 FINDINGS: Cardiovascular: The heart is normal in size. No pericardial effusion. The aorta is normal in caliber. No dissection. Scattered  atherosclerotic calcifications. The branch vessels are patent. Mediastinum/Nodes: Scattered mediastinal lymph nodes are noted. These are less than 8 mm. The esophagus is grossly normal. Lungs/Pleura: Numerous bilateral pulmonary nodules consistent with pulmonary metastatic disease. Index nodule in the left upper lobe on image number 56 of series 5 measures 6.5 mm. Left lower lobe pulmonary nodule on image number 94 measures 8.5 mm. 10 mm right lower lobe pulmonary nodule on image number 78. Small to moderate-sized right pleural effusions noted with significant overlying atelectasis. There is also left basilar atelectasis. Upper Abdomen: Numerous benign-appearing hepatic cysts along with findings of hepatic metastatic disease, better demonstrated on the prior CT scan. Musculoskeletal: No significant bony findings. IMPRESSION: 1. Diffuse pulmonary metastatic disease. 2. Scattered borderline mediastinal and hilar lymph nodes. 3. Small to moderate-sized right pleural effusion with significant overlying right lower lobe atelectasis. Aortic Atherosclerosis (ICD10-I70.0). Electronically Signed   By: Marijo Sanes M.D.   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 EXAM: MRI ABDOMEN WITHOUT AND WITH CONTRAST TECHNIQUE: Multiplanar multisequence MR imaging of the abdomen was performed both before and after the administration of intravenous contrast. CONTRAST:  10 cc Gadavist IV. COMPARISON:  04/11/2019 CT abdomen/pelvis. FINDINGS: Lower chest: Small right and trace left dependent pleural effusions. Numerous pulmonary nodules are scattered at both lung bases. Hepatobiliary: Mild hepatomegaly. Background mild diffuse hepatic steatosis. The liver parenchyma is substantially replaced by patchy confluent enhancing masses with signal intensity equal  into the spleen, with discrete liver masses difficult to delineate. Representative segment 6 right liver lobe 4.5 x 2.5 cm mass (series 4/image 41), peripheral segment 8 right liver lobe 3.5 x 3.0 cm mass (series 4/image 23) and approximately 11.4 x 10.6 cm mass centered in segment 4A left liver lobe (series 4/image 26) with extension into segments 1, 2 and 8. Numerous simple liver cysts are scattered throughout the liver, largest 4.9 cm in the lateral segment left liver lobe. Normal gallbladder with no cholelithiasis. No biliary ductal dilatation. Common bile duct diameter 5 mm. No evidence of choledocholithiasis. Pancreas: No pancreatic mass or duct dilation.  No pancreas divisum. Spleen: Normal size. No mass. Adrenals/Urinary Tract: No discrete adrenal nodules. No hydronephrosis. Small simple bilateral renal cysts, largest 1.2 cm in the interpolar right kidney. No suspicious renal masses. Stomach/Bowel: Normal non-distended stomach. Visualized small and large bowel is normal caliber, with no bowel wall thickening. Vascular/Lymphatic: Normal caliber abdominal aorta. Patent portal, splenic, hepatic and renal veins. No pathologically enlarged lymph nodes in the abdomen. Other: No abdominal ascites or focal fluid collection. Partial visualization of heterogeneously enhancing right adnexal mass Musculoskeletal: No aggressive appearing focal osseous lesions. IMPRESSION: 1. Patchy confluent enhancing masses of stage Deatra Canter replaced the liver parenchyma, compatible with infiltrative hepatic metastatic disease, as detailed. 2. Numerous pulmonary nodules at the lung bases as better delineated on chest CT from earlier today, compatible with pulmonary metastatic disease. 3. Partial visualization of heterogeneously enhancing right adnexal mass, as described on CT abdomen/pelvis study from 1 day prior, suspicious for primary ovarian malignancy.  4. Small right and trace left dependent pleural effusions. Electronically Signed   By:  Ilona Sorrel M.D.   On: 04/13/2019 08:26   Ct Abdomen Pelvis W Contrast  Result Date: 04/11/2019 CLINICAL DATA:  Nausea and vomiting. EXAM: CT ABDOMEN AND PELVIS WITH CONTRAST TECHNIQUE: Multidetector CT imaging of the abdomen and pelvis was performed using the standard protocol following bolus administration of intravenous contrast. CONTRAST:  28mL OMNIPAQUE IOHEXOL 300 MG/ML  SOLN COMPARISON:  CT dated 01/04/2019. FINDINGS: Lower chest: There are innumerable pulmonary nodules involving the partially visualized lung bases. There is a trace right-sided pleural effusion. The heart size is mildly enlarged. Hepatobiliary: Multiple presumed liver cysts are again identified. There is a somewhat heterogeneous appearance of the liver parenchyma with findings suspicious for underlying metastatic lesions. These are not well appreciated on this exam secondary to contrast bolus timing. The liver size has increased from prior study currently measuring approximately 21.1 cm (previously measuring 15.3 cm). The gallbladder is unremarkable. Pancreas: Unremarkable. No pancreatic ductal dilatation or surrounding inflammatory changes. Spleen: Normal in size without focal abnormality. Adrenals/Urinary Tract: There is mild nodularity of the left adrenal gland which is stable from prior studies. There is no hydronephrosis. The right adrenal gland is unremarkable. The bladder is mostly decompressed which limits evaluation. Stomach/Bowel: There is severe sigmoid diverticulosis without CT evidence of diverticulitis. The appendix is unremarkable. There is no small bowel obstruction. The stomach is unremarkable. Vascular/Lymphatic: There are few mildly prominent retroperitoneal lymph nodes measuring up to approximately 9 mm (axial series 2, image 46). Atherosclerotic changes are noted of the thoracic aorta and its branch vessels. Reproductive: There is a complex solid and cystic mass involving the right ovary currently measuring  approximately 12.2 by 9.2 cm on the sagittal series (previously measuring approximately 10.6 x 8 cm.) Other: No abdominal wall hernia or abnormality. No abdominopelvic ascites. Musculoskeletal: No acute or significant osseous findings. IMPRESSION: 1. 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 There is a complex right ovarian mass which has increased in size from prior study dated 01/04/2019. In addition, there are innumerable new small pulmonary nodules bilaterally concerning for metastatic disease to the lungs. There is a heterogeneous appearance to the liver with interval increase in size from prior study, highly suspicious for underlying hepatic metastatic disease. 2. Severe sigmoid diverticulosis without CT evidence of diverticulitis. Electronically Signed   By: Constance Holster M.D.   On: 04/11/2019 20:07   US Biopsy (liver)  Result Date: 04/13/2019 INDICATION: Right adnexal mass, multiple pulmonary nodules and diffuse ill-defined masses in the liver. The patient presents for liver biopsy. EXAM: ULTRASOUND GUIDED CORE BIOPSY OF LIVER MEDICATIONS: None. ANESTHESIA/SEDATION: Fentanyl 100 mcg IV; Versed 2.0 mg IV Moderate Sedation Time:  16 minutes. The patient was continuously monitored during the procedure by the interventional radiology nurse under my direct supervision. PROCEDURE: The procedure, risks, benefits, and alternatives were explained to the patient. Questions regarding the procedure were encouraged and answered. The patient understands and consents to the procedure. Ultrasound was performed of the liver. The anterior abdominal wall was prepped with chlorhexidine in a sterile fashion, and a sterile drape was applied covering the operative field. A sterile gown and sterile gloves were used for the procedure. Local anesthesia was provided with 1% Lidocaine. A 17 gauge trocar needle was advanced into the left lobe of the liver under ultrasound guidance. After confirming  needle tip position, coaxial 18 gauge core biopsy samples were obtained. Three core biopsy samples were submitted in  formalin. Gel-Foam pledgets were advanced through the outer needle as the needle was retracted. COMPLICATIONS: None immediate. FINDINGS: Lesions in the liver are very ill-defined by ultrasound and not discrete. An area of heterogeneous parenchyma in the left lobe was targeted. Solid tissue was obtained. IMPRESSION: Ultrasound-guided core biopsy performed in the left lobe of the liver. Electronically Signed   By: Aletta Edouard M.D.   On: 04/13/2019 17:17   Dg Chest Port 1 View  Result Date: 04/20/2019 CLINICAL DATA:  Shortness of breath EXAM: PORTABLE CHEST 1 VIEW COMPARISON:  04/16/2019 FINDINGS: Cardiac shadows within normal limits. Right basilar atelectasis is again seen and stable. Likely underlying pleural effusion is present as well. No other focal abnormality is noted. IMPRESSION: Stable right basilar atelectasis and effusion. Electronically Signed   By: Inez Catalina M.D.   On: 04/20/2019 08:44   Dg Chest Port 1 View  Result Date: 04/13/2019 CLINICAL DATA:  Status post right thoracentesis today. EXAM: PORTABLE CHEST 1 VIEW COMPARISON:  CT chest 04/12/2019. Single-view of the chest 04/11/2019. FINDINGS: Small right pleural effusion is seen. No pneumothorax. No left effusion. Heart size is normal. Innumerable small bilateral pulmonary nodules are noted. IMPRESSION: Negative for pneumothorax after thoracentesis. Innumerable bilateral pulmonary nodules. Electronically Signed   By: Inge Rise M.D.   On: 04/13/2019 16:35   Dg Chest Portable 1 View  Result Date: 04/11/2019 CLINICAL DATA:  Vomiting. EXAM: PORTABLE CHEST 1 VIEW COMPARISON:  None. FINDINGS: The heart size is enlarged. The lung volumes are low. There are prominent interstitial lung markings bilaterally. There is likely atelectasis or scarring at the lung bases. No acute osseous abnormality. No pneumothorax. IMPRESSION:  Mild cardiac enlargement with low lung volumes. Airspace opacity at the right lung base is favored to represent atelectasis. Electronically Signed   By: Constance Holster M.D.   On: 04/11/2019 19:11

## 2019-04-20 NOTE — Discharge Summary (Signed)
Discharge Summary  Sandra Richardson OXB:[id number] DOB: 08/29/1951  PCP: McLean-Scocuzza, Nino Glow, MD  Admit date: 04/11/2019 Discharge date: 04/20/2019  Time spent: 35 mins  Recommendations for Outpatient Follow-up:  1. Hospice  Discharge Diagnoses:  Active Hospital Problems   Diagnosis Date Noted   Liver masses    Hypercalcemia of malignancy 04/11/2019   Nausea and vomiting 04/11/2019   Ovarian mass 04/11/2019   Multiple lung nodules on CT 04/11/2019    Resolved Hospital Problems  No resolved problems to display.    Discharge Condition: Poor prognosis overall, for home hospice  Diet recommendation: As tolerated   Vitals:   04/20/19 1341 04/20/19 1445  BP:  (!) 136/57  Pulse: 94 100  Resp: 16 19  Temp:  (!) 97.5 F (36.4 C)  SpO2: 93% 94%    History of present illness:  Sandra Richardson is a 68 y.o.femalewith past medical history significant for complex ovarian cyst who was in her usual state of health until about 8 days ago when she started develop weakness, nausea and vomiting. She was unable to tolerate much food because of anorexia and the nausea. She was seen at an outside ED last week where she was noted to have a mildly elevated calcium at 11.5 and was treated with IV hydration and Zofran with temporary improvement. Patient was discharged home on Zofran however the nausea has worsened and is not being able to be controlled on the Zofran alone. Patient now comes in for management of weakness nausea and vomiting. She denies fevers or chills. She denies any blood in her vomitus. Denies any abdominal pain or swelling. Does admit to a 20 pound weight loss over the past couple of months which was unintentional. The patient is noted to have an elevated calcium at 12.5 in the ED. 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 Patient was admitted for dehydration, hypercalcemia and work-up of her likely ovarian malignancy.    Patient  with poor prognosis overall.  Medical oncology saw patient today, patient and family member agreed on home hospice.  Palliative/hospice consulted, made patient DNR. 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  Her niece will be taking care of her at home -Hospice team will be following   Sepsis secondary to community-acquired pneumonia, present on admission Afebrile with leukocytosis (may also be due to malignancy) Repeat chest x-ray reveals right lobar pneumonia Discontinue Rocephin, azithromycin   Acute hypoxemic respiratory failure -Currently on 2L [place] O2, will continue Likely due to above  Elevated LFT -likely due to malignancy  HLD -Discontinue Lipitor due to elevated LFTs/hospice  Essential hypertension  Stable -Discontinue amlodipine, ACE due to AKI/hospice  AKI  Stable -Baseline Cr 1          Malnutrition Type:  Nutrition Problem: Inadequate oral intake Etiology: acute illness, nausea, vomiting, poor appetite   Malnutrition  Characteristics:  Signs/Symptoms: per patient/family report   Nutrition Interventions:  Interventions: Ensure Enlive (each supplement provides 350kcal and 20 grams of protein), MVI   Estimated body mass index is 45.79 kg/m as calculated from the following:   Height as of this encounter: 5\' 4"  (1.626 m).   Weight as of this encounter: 121 kg.    Procedures:  Liver biopsy  Consultations:  GYN oncology  Medical oncology  IR  Discharge Exam: BP (!) 136/57 (BP Location: Left Arm)    Pulse 100    Temp (!) 97.5 F (36.4 C) (Oral)    Resp 19    Ht 5\' 4"  (1.626 m)    Wt 121 kg    LMP  (LMP Unknown)    SpO2 94%    BMI 45.79 kg/m   General: NAD Cardiovascular: S1, S2 present Respiratory: Diminished breath sounds bilaterally  Discharge Instructions You were cared for by a hospitalist during your hospital stay. If you have any questions about your discharge medications or the care you received while you were in the hospital after you are discharged, you can call the unit and asked to speak with the hospitalist on call if the hospitalist that took care of you is not available. Once you are discharged, your primary care physician will handle any further medical issues. Please note that NO REFILLS for any discharge medications will be authorized once you are discharged, as it is imperative that you return to your primary care physician (or establish a relationship with a primary care physician if you do not have one) for your aftercare needs so that they can reassess your need for medications and monitor your lab values.   Allergies as of 04/20/2019      Reactions   Desyrel [trazodone]    Paradoxical   Diclofenac Sodium Rash   Seasonal Ic [cholestatin]    Sinus drainage      Medication List    STOP taking these medications   amLODipine-benazepril 10-20 MG capsule Commonly known as: LOTREL   atorvastatin 20 MG tablet Commonly known as: LIPITOR   promethazine 25 MG  tablet Commonly known as: PHENERGAN Replaced by: promethazine 12.5 MG suppository   Vitamin D-3 125 MCG (5000 UT) Tabs     TAKE these medications   acetaminophen 500 MG tablet Commonly known as: TYLENOL Take 500-1,000 mg by mouth every 6 (six) hours as needed (pain.).   dexamethasone 2 MG tablet Commonly known as: DECADRON Take 1 tablet (2 mg total) by mouth 2 (two) times daily with breakfast and lunch. Start taking on: April 21, 2019   LORazepam 1 MG tablet Commonly known as: ATIVAN Place 1 tablet (1 mg total) under the tongue every 4 (four) hours as needed for anxiety or sleep (or nausea).   morphine CONCENTRATE 10 MG/0.5ML Soln concentrated solution Take 0.25 mLs (5 mg total) by mouth every hour as needed for moderate pain or shortness of breath.   ondansetron 8 MG disintegrating tablet Commonly known as: ZOFRAN-ODT Take 1 tablet (8 mg total) by mouth every 8 (eight) hours as needed for nausea or vomiting. What changed:   medication strength  how much to take   promethazine 12.5 MG suppository Commonly known as: PHENERGAN Place 1 suppository (12.5 mg  total) rectally every 6 (six) hours as needed for nausea or vomiting. Replaces: promethazine 25 MG tablet            Durable Medical Equipment  (From admission, onward)         Start     Ordered   04/20/19 1639  For home use only DME oxygen  Once    Question Answer Comment  Length of Need 6 Months   Liters per Minute 2   Frequency Continuous (stationary and portable oxygen unit needed)   Oxygen delivery system Gas      04/20/19 1639         Allergies  Allergen Reactions   Desyrel [Trazodone]     Paradoxical   Diclofenac Sodium Rash   Seasonal Ic [Cholestatin]     Sinus drainage      The results of significant diagnostics from this hospitalization (including imaging, microbiology, ancillary and laboratory) are listed below for reference.    Significant Diagnostic Studies: Dg Chest 2  View  Result Date: 04/16/2019 CLINICAL DATA:  Respiratory failure EXAM: CHEST - 2 VIEW COMPARISON:  April 13, 2019 chest radiograph and chest CT April 12, 2019 FINDINGS: There is a fairly small pleural effusion on the right with right base atelectasis and airspace consolidation. Pulmonary nodular lesions are seen throughout the lungs, better delineated on recent CT. Heart size and pulmonary vascularity are normal. No adenopathy demonstrable. No bone lesions. IMPRESSION: Fairly small right pleural effusion with atelectasis and consolidation in the right base region. Suspect pneumonia in the right base. Small pulmonary nodular lesions throughout the lungs diffusely are noted, better seen on recent CT. Cardiac silhouette is within normal limits. No adenopathy is demonstrable by radiography. Electronically Signed   By: Lowella Grip III M.D.   On: 04/16/2019 15:13   Ct Chest W Contrast  Result Date: 04/12/2019 CLINICAL DATA:  Pelvic mass seen on recent CT abdomen/pelvis with suspected hepatic metastatic disease. Evaluate the chest for metastatic disease. EXAM: CT CHEST WITH CONTRAST TECHNIQUE: Multidetector CT imaging of the chest was performed during intravenous contrast administration. CONTRAST:  73mL OMNIPAQUE IOHEXOL 300 MG/ML  SOLN COMPARISON:  CT abdomen/pelvis 04/11/2019 FINDINGS: Cardiovascular: The heart is normal in size. No pericardial effusion. The aorta is normal in caliber. No dissection. Scattered atherosclerotic calcifications. The branch vessels are patent. Mediastinum/Nodes: Scattered mediastinal lymph nodes are noted. These are less than 8 mm. The esophagus is grossly normal. Lungs/Pleura: Numerous bilateral pulmonary nodules consistent with pulmonary metastatic disease. Index nodule in the left upper lobe on image number 56 of series 5 measures 6.5 mm. Left lower lobe pulmonary nodule on image number 94 measures 8.5 mm. 10 mm right lower lobe pulmonary nodule on image number 78. Small to  moderate-sized right pleural effusions noted with significant overlying atelectasis. There is also left basilar atelectasis. Upper Abdomen: Numerous benign-appearing hepatic cysts along with findings of hepatic metastatic disease, better demonstrated on the prior CT scan. Musculoskeletal: No significant bony findings. IMPRESSION: 1. Diffuse pulmonary metastatic disease. 2. Scattered borderline mediastinal and hilar lymph nodes. 3. Small to moderate-sized right pleural effusion with significant overlying right lower lobe atelectasis. Aortic Atherosclerosis (ICD10-I70.0). Electronically Signed   By: Marijo Sanes M.D.   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 EXAM: MRI ABDOMEN WITHOUT AND WITH CONTRAST TECHNIQUE: Multiplanar multisequence MR  imaging of the abdomen was performed both before and after the administration of intravenous contrast. CONTRAST:  10 cc Gadavist IV. COMPARISON:  04/11/2019 CT abdomen/pelvis. FINDINGS: Lower chest: Small right and trace left dependent pleural effusions. Numerous pulmonary nodules are scattered at both lung bases. Hepatobiliary: Mild hepatomegaly. Background mild diffuse hepatic steatosis. The liver parenchyma is substantially replaced by patchy confluent enhancing masses with signal intensity equal into the spleen, with discrete liver masses difficult to delineate. Representative segment 6 right liver lobe 4.5 x 2.5 cm mass (series 4/image 41), peripheral segment 8 right liver lobe 3.5 x 3.0 cm mass (series 4/image 23) and approximately 11.4 x 10.6 cm mass centered in segment 4A left liver lobe (series 4/image 26) with extension into segments 1, 2 and 8. Numerous simple liver cysts are scattered throughout the liver, largest 4.9 cm in the lateral segment left liver lobe. Normal gallbladder with  no cholelithiasis. No biliary ductal dilatation. Common bile duct diameter 5 mm. No evidence of choledocholithiasis. Pancreas: No pancreatic mass or duct dilation.  No pancreas divisum. Spleen: Normal size. No mass. Adrenals/Urinary Tract: No discrete adrenal nodules. No hydronephrosis. Small simple bilateral renal cysts, largest 1.2 cm in the interpolar right kidney. No suspicious renal masses. Stomach/Bowel: Normal non-distended stomach. Visualized small and large bowel is normal caliber, with no bowel wall thickening. Vascular/Lymphatic: Normal caliber abdominal aorta. Patent portal, splenic, hepatic and renal veins. No pathologically enlarged lymph nodes in the abdomen. Other: No abdominal ascites or focal fluid collection. Partial visualization of heterogeneously enhancing right adnexal mass Musculoskeletal: No aggressive appearing focal osseous lesions. IMPRESSION: 1. Patchy confluent enhancing masses of stage Deatra Canter replaced the liver parenchyma, compatible with infiltrative hepatic metastatic disease, as detailed. 2. Numerous pulmonary nodules at the lung bases as better delineated on chest CT from earlier today, compatible with pulmonary metastatic disease. 3. Partial visualization of heterogeneously enhancing right adnexal mass, as described on CT abdomen/pelvis study from 1 day prior, suspicious for primary ovarian malignancy. 4. Small right and trace left dependent pleural effusions. Electronically Signed   By: Ilona Sorrel M.D.   On: 04/13/2019 08:26   Ct Abdomen Pelvis W Contrast  Result Date: 04/11/2019 CLINICAL DATA:  Nausea and vomiting. EXAM: CT ABDOMEN AND PELVIS WITH CONTRAST TECHNIQUE: Multidetector CT imaging of the abdomen and pelvis was performed using the standard protocol following bolus administration of intravenous contrast. CONTRAST:  3mL OMNIPAQUE IOHEXOL 300 MG/ML  SOLN COMPARISON:  CT dated 01/04/2019. FINDINGS: Lower chest: There are innumerable pulmonary nodules involving the  partially visualized lung bases. There is a trace right-sided pleural effusion. The heart size is mildly enlarged. Hepatobiliary: Multiple presumed liver cysts are again identified. There is a somewhat heterogeneous appearance of the liver parenchyma with findings suspicious for underlying metastatic lesions. These are not well appreciated on this exam secondary to contrast bolus timing. The liver size has increased from prior study currently measuring approximately 21.1 cm (previously measuring 15.3 cm). The gallbladder is unremarkable. Pancreas: Unremarkable. No pancreatic ductal dilatation or surrounding inflammatory changes. Spleen: Normal in size without focal abnormality. Adrenals/Urinary Tract: There is mild nodularity of the left adrenal gland which is stable from prior studies. There is no hydronephrosis. The right adrenal gland is unremarkable. The bladder is mostly decompressed which limits evaluation. Stomach/Bowel: There is severe sigmoid diverticulosis without CT evidence of diverticulitis. The appendix is unremarkable. There is no small bowel obstruction. The stomach is unremarkable. Vascular/Lymphatic: There are few mildly prominent retroperitoneal lymph nodes measuring up to approximately 9 mm (  axial series 2, image 46). Atherosclerotic changes are noted of the thoracic aorta and its branch vessels. Reproductive: There is a complex solid and cystic mass involving the right ovary currently measuring approximately 12.2 by 9.2 cm on the sagittal series (previously measuring approximately 10.6 x 8 cm.) Other: No abdominal wall hernia or abnormality. No abdominopelvic ascites. Musculoskeletal: No acute or significant osseous findings. IMPRESSION: 1. 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 There is a complex right ovarian mass which has increased in size from prior study dated 01/04/2019. In addition, there are innumerable new small pulmonary nodules bilaterally concerning  for metastatic disease to the lungs. There is a heterogeneous appearance to the liver with interval increase in size from prior study, highly suspicious for underlying hepatic metastatic disease. 2. Severe sigmoid diverticulosis without CT evidence of diverticulitis. Electronically Signed   By: Constance Holster M.D.   On: 04/11/2019 20:07   US Biopsy (liver)  Result Date: 04/13/2019 INDICATION: Right adnexal mass, multiple pulmonary nodules and diffuse ill-defined masses in the liver. The patient presents for liver biopsy. EXAM: ULTRASOUND GUIDED CORE BIOPSY OF LIVER MEDICATIONS: None. ANESTHESIA/SEDATION: Fentanyl 100 mcg IV; Versed 2.0 mg IV Moderate Sedation Time:  16 minutes. The patient was continuously monitored during the procedure by the interventional radiology nurse under my direct supervision. PROCEDURE: The procedure, risks, benefits, and alternatives were explained to the patient. Questions regarding the procedure were encouraged and answered. The patient understands and consents to the procedure. Ultrasound was performed of the liver. The anterior abdominal wall was prepped with chlorhexidine in a sterile fashion, and a sterile drape was applied covering the operative field. A sterile gown and sterile gloves were used for the procedure. Local anesthesia was provided with 1% Lidocaine. A 17 gauge trocar needle was advanced into the left lobe of the liver under ultrasound guidance. After confirming needle tip position, coaxial 18 gauge core biopsy samples were obtained. Three core biopsy samples were submitted in formalin. Gel-Foam pledgets were advanced through the outer needle as the needle was retracted. COMPLICATIONS: None immediate. FINDINGS: Lesions in the liver are very ill-defined by ultrasound and not discrete. An area of heterogeneous parenchyma in the left lobe was targeted. Solid tissue was obtained. IMPRESSION: Ultrasound-guided core biopsy performed in the left lobe of the liver.  Electronically Signed   By: Aletta Edouard M.D.   On: 04/13/2019 17:17   Dg Chest Port 1 View  Result Date: 04/20/2019 CLINICAL DATA:  Shortness of breath EXAM: PORTABLE CHEST 1 VIEW COMPARISON:  04/16/2019 FINDINGS: Cardiac shadows within normal limits. Right basilar atelectasis is again seen and stable. Likely underlying pleural effusion is present as well. No other focal abnormality is noted. IMPRESSION: Stable right basilar atelectasis and effusion. Electronically Signed   By: Inez Catalina M.D.   On: 04/20/2019 08:44   Dg Chest Port 1 View  Result Date: 04/13/2019 CLINICAL DATA:  Status post right thoracentesis today. EXAM: PORTABLE CHEST 1 VIEW COMPARISON:  CT chest 04/12/2019. Single-view of the chest 04/11/2019. FINDINGS: Small right pleural effusion is seen. No pneumothorax. No left effusion. Heart size is normal. Innumerable small bilateral pulmonary nodules are noted. IMPRESSION: Negative for pneumothorax after thoracentesis. Innumerable bilateral pulmonary nodules. Electronically Signed   By: Inge Rise M.D.   On: 04/13/2019 16:35   Dg Chest Portable 1 View  Result Date: 04/11/2019 CLINICAL DATA:  Vomiting. EXAM: PORTABLE CHEST 1 VIEW COMPARISON:  None. FINDINGS: The heart size is enlarged. The lung volumes are low. There  are prominent interstitial lung markings bilaterally. There is likely atelectasis or scarring at the lung bases. No acute osseous abnormality. No pneumothorax. IMPRESSION: Mild cardiac enlargement with low lung volumes. Airspace opacity at the right lung base is favored to represent atelectasis. Electronically Signed   By: Constance Holster M.D.   On: 04/11/2019 19:11    Microbiology: Recent Results (from the past 240 hour(s))  SARS Coronavirus 2 (CEPHEID - Performed in [place] hospital lab), Hosp Order     Status: None   Collection Time: 04/11/19  8:56 PM   Specimen: Nasopharyngeal Swab  Result Value Ref Range Status   SARS Coronavirus 2 NEGATIVE NEGATIVE  Final    Comment: (NOTE) If result is NEGATIVE SARS-CoV-2 target nucleic acids are NOT DETECTED. The SARS-CoV-2 RNA is generally detectable in upper and lower  respiratory specimens during the acute phase of infection. The lowest  concentration of SARS-CoV-2 viral copies this assay can detect is 250  copies / mL. A negative result does not preclude SARS-CoV-2 infection  and should not be used as the sole basis for treatment or other  patient management decisions.  A negative result may occur with  improper specimen collection / handling, submission of specimen other  than nasopharyngeal swab, presence of viral mutation(s) within the  areas targeted by this assay, and inadequate number of viral copies  (<250 copies / mL). A negative result must be combined with clinical  observations, patient history, and epidemiological information. If result is POSITIVE SARS-CoV-2 target nucleic acids are DETECTED. The SARS-CoV-2 RNA is generally detectable in upper and lower  respiratory specimens dur ing the acute phase of infection.  Positive  results are indicative of active infection with SARS-CoV-2.  Clinical  correlation with patient history and other diagnostic information is  necessary to determine patient infection status.  Positive results do  not rule out bacterial infection or co-infection with other viruses. If result is PRESUMPTIVE POSTIVE SARS-CoV-2 nucleic acids MAY BE PRESENT.   A presumptive positive result was obtained on the submitted specimen  and confirmed on repeat testing.  While 2019 novel coronavirus  (SARS-CoV-2) nucleic acids may be present in the submitted sample  additional confirmatory testing may be necessary for epidemiological  and / or clinical management purposes  to differentiate between  SARS-CoV-2 and other Sarbecovirus currently known to infect humans.  If clinically indicated additional testing with an alternate test  methodology [phone]) is advised. The  SARS-CoV-2 RNA is generally  detectable in upper and lower respiratory sp ecimens during the acute  phase of infection. The expected result is Negative. Fact Sheet for Patients:  StrictlyIdeas.no Fact Sheet for Healthcare Providers: BankingDealers.co.za This test is not yet approved or cleared by the [place] FDA and has been authorized for detection and/or diagnosis of SARS-CoV-2 by FDA under an Emergency Use Authorization (EUA).  This EUA will remain in effect (meaning this test can be used) for the duration of the COVID-19 declaration under Section 564(b)(1) of the Act, 21 U.S.C. section 360bbb-3(b)(1), unless the authorization is terminated or revoked sooner. Performed at [facility], [address] [place], [place] [address]      Labs: Basic Metabolic Panel: Recent Labs  Lab 04/16/19 0431 04/17/19 0428 04/18/19 6734 04/18/19 0738 04/19/19 0529 04/20/19 0552  NA 134* 135 135  --  135 134*  K 3.9 4.3 4.7  --  4.7 4.9  CL 105 105 107  --  106 103  CO2 20* 18* 16*  --  17* 17*  GLUCOSE 100* 91 80  --  84 81  BUN 31* 35* 36*  --  36* 38*  CREATININE 1.07* 1.08* 1.10*  --  1.12* 1.13*  CALCIUM 9.2 8.9 8.6*  --  8.3* 7.9*  MG  --   --   --  2.8* 3.0* 2.9*   Liver Function Tests: Recent Labs  Lab 04/16/19 0431 04/17/19 0428 04/18/19 0608 04/19/19 0529 04/20/19 0552  AST 324* 411* 430* 441* 442*  ALT 120* 139* 138* 149* 141*  ALKPHOS 794* 828* 907* 941* 927*  BILITOT 2.1* 2.1* 2.0* 2.9* 3.0*  PROT 6.4* 6.3* 6.4* 6.5 6.2*  ALBUMIN 2.8* 2.5* 2.4* 2.6* 2.3*   No results for input(s): LIPASE, AMYLASE in the last 168 hours. No results for input(s): AMMONIA in the last 168 hours. CBC: Recent Labs  Lab 04/16/19 0431 04/17/19 0428 04/18/19 0608 04/19/19 0529 04/20/19 0552  WBC 18.3* 18.0* 22.4* 22.4* 21.5*  NEUTROABS  --   --   --   --  15.1*  HGB 10.7* 11.0* 10.8* 11.1* 10.3*  HCT 33.3* 34.8* 33.1* 33.6* 30.7*   MCV 86.5 88.3 83.6 83.6 84.3  PLT 334 333 345 354 385   Cardiac Enzymes: No results for input(s): CKTOTAL, CKMB, CKMBINDEX, TROPONINI in the last 168 hours. BNP: BNP (last 3 results) No results for input(s): BNP in the last 8760 hours.  ProBNP (last 3 results) No results for input(s): PROBNP in the last 8760 hours.  CBG: No results for input(s): GLUCAP in the last 168 hours.     Signed:  Alma Friendly, MD [facility] 04/20/2019, 5:36 PM

## 2019-04-20 NOTE — Telephone Encounter (Signed)
[phone] 

## 2019-04-20 NOTE — Progress Notes (Signed)
Chaplain following due to spiritual care consult: Entered 04/20/19  8:53   Attended 04/20/19  13:30    Intervention:  Provided support at bedside around end of life, discharge home with hospice.  Sandra Richardson is a faithful christian - speaks of God's presence with her mother and father during the end of their lives.  She is hopeful for a peaceful death.  She is supported outside of hospital by [employer], sister and brother.   Sandra Richardson spoke with chaplain about relationship with sister and boundaries she has set around sister's behavior in order to facilitate the space Sandra Richardson wants for her end of life process.  Sandra Richardson is supported in this by her minister who advocates for her.   Chaplain shared prayers at bedside at [facility] request.      Jerene Pitch, MDiv, [facility]

## 2019-04-20 NOTE — Progress Notes (Signed)
New referral for home hospice received by [facility] liaison.  Sandra Richardson will be east patient.  I called and spoke with Jetty Peeks via telephone to explain hospice services.  She is in agreement with and request hospice upon discharge from the hospital.  I also called and spoke with patient's HCPOA, [name].  She states they need the following DME:  Hospital bed, over bed table, walker, BSC, shower chair.  I see [name] is currently on oxygen and when I spoke with her, she states it does help her breathe, so I will ask for an order for oxygen in the home at 2-4 L/[place] prn for comfort.  Rayanne and Olivia Mackie are ready for her to discharge once DME is delivered.  I will call and speak with the case manager to request order for oxygen.  Information faxed to referral intake.  Thank you for allowing participation in this patient's care.  Dimas Aguas, RN Clinical Nurse Liaison [facility] [phone]

## 2019-04-20 NOTE — Progress Notes (Signed)
I spoke with the patient again to follow-up on our discussion from this morning The patient has decided against palliative chemo. She is planning to go home on home hospice. Her niece will be taking care of her at home  I will sign off No follow-up is needed

## 2019-04-20 NOTE — Progress Notes (Signed)
   04/20/19 1851  AVS Discharge Documentation  AVS Discharge Instructions Including Medications Provided to patient/caregiver  Name of Person Receiving AVS Discharge Instructions Including Medications Sandra Richardson  Name of Clinician That Reviewed AVS Discharge Instructions Including Medications Rich Reining

## 2019-04-20 NOTE — Consult Note (Signed)
Consultation Note Date: 04/20/2019   Patient Name: Sandra Richardson  DOB: [birth year]  MRN: [id number]  Age / Sex: 68 y.o., female  PCP: McLean-Scocuzza, Nino Glow, MD Referring Physician: Alma Friendly, MD  Reason for Consultation: Establishing goals of care and Terminal Care  HPI/Patient Profile: 68 y.o. female  with past medical history of HTN, ovarian mass,  admitted on 04/11/2019 with persistent nausea and vomiting. 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 Consult with oncology- terminal stage cancer with poor prognosis- salvage single agent chemotherapy vs hospice offered. Palliative medicine consulted for [place].   Clinical Assessment and Goals of Care:  I have reviewed medical records including EPIC notes, labs and imaging, received report from Sandra Richardson who had been in previous contact with patient's HCPOA Sandra Richardson, assessed the patient and then met at the bedside along with patient to discuss diagnosis prognosis, Sandra Richardson, EOL wishes, disposition and options.  I introduced Palliative Medicine as specialized medical care for people living with serious illness. It focuses on providing relief from the symptoms and stress of a serious illness. The goal is to improve quality of life for both the patient and the family.  We discussed a brief life review of the patient. She was living independently at home prior to admission- has family living close by.   As far as functional and nutritional status- her functional status has decreased dramatically in the setting of weakness and decreased oral intake over the last week due to nausea, vomiting and diarrhea. She has been able to ambulate to the bathroom.    We discussed her current illness and what it means in the larger context of her on-going co-morbidities.  Natural disease trajectory  and expectations at EOL were discussed. She states that she cannot bare to go on for a year and not expect to feel much better than she feels now, knowing that she will die eventually anyway. She states she would prefer to go home and focus on symptom management. Her hopes are that she does not suffer. She worries about her nieces and nephews and the grief they have experienced- losing parents recently.   The difference between aggressive medical intervention and comfort care was discussed in light of the patient's goals of care. Sandra Richardson prefers comfort care only with the goal being a peaceful dying process.   Advanced directives, concepts specific to code status, artifical feeding and hydration, and rehospitalization were considered and discussed. Sandra Richardson requests DNR status.   Sandra Richardson requests visit from [place]- she says she is very spiritual and has trust in God.   We discussed her symptoms. She notes phenergan is working well, however, this can make her feel very weak. She would like to have more energy. Having some trouble sleeping, some anxiety. She was given trazadone one night- but this had the opposite effect. Pain is controlled with prn morphine IV- but we will need to transition this to po- we discussed possible long acting medication  in the future- but her requirements can be managed with aggressive prn dosing for now. We discussed taking the dose prior to the pain getting severe. She complains of pain in her back and chest, easily relieved by morphine. She is averaging about 67m IV per 24 hours.   Primary Decision Maker PATIENT    SUMMARY OF RECOMMENDATIONS -D/C home with Hospice- will need equipment -DNR -Dexamethasone 19mIV x1 now for nausea, fatigue and pain Symptom management- discharging physician- please kindly write for at discharge -Morphine concentrated solution 74m40mublingual q1hr prn for pain/SOB -Dexamethasone 2mg19m with breakfast and lunch starting tomorrow -Phenergan  suppository 12.74mg 49mr  prn for nausea -Ondansetron ODT q8hr prn for nausea     Code Status/Advance Care Planning:  DNR  Psycho-social/Spiritual:   Desire for further Chaplaincy support:yes  Prognosis:    < 3 months due to aggressive cancer, transition to full comfort with Hospice support  Discharge Planning: Home with Hospice  Primary Diagnoses: Present on Admission: **None**   I have reviewed the medical record, interviewed the patient and family, and examined the patient. The following aspects are pertinent.  Past Medical History:  Diagnosis Date  . Allergy   . Chicken pox   . Complication of anesthesia   . History of kidney stones   . Hyperlipidemia   . Hypertension   . Liver cyst   . Ovarian cyst    1979  . PONV (postoperative nausea and vomiting)   . UTI (urinary tract infection)    Social History   Socioeconomic History  . Marital status: Single    Spouse name: Not on file  . Number of children: Not on file  . Years of education: Not on file  . Highest education level: Not on file  Occupational History  . Occupation: custoFish farm manageromment: tax office  Social Needs  . Financial resource strain: Not on file  . Food insecurity    Worry: Not on file    Inability: Not on file  . Transportation needs    Medical: Not on file    Non-medical: Not on file  Tobacco Use  . Smoking status: Never Smoker  . Smokeless tobacco: Never Used  Substance and Sexual Activity  . Alcohol use: Not Currently    Alcohol/week: 0.0 standard drinks  . Drug use: Not Currently  . Sexual activity: Not Currently  Lifestyle  . Physical activity    Days per week: Not on file    Minutes per session: Not on file  . Stress: Not on file  Relationships  . Social conneHerbalisthone: Not on file    Gets together: Not on file    Attends religious service: Not on file    Active member of club or organization: Not on file    Attends meetings of clubs or  organizations: Not on file    Relationship status: Not on file  Other Topics Concern  . Not on file  Social History Narrative   No kids    Owns guns    Wears seat belts    Safe in relationship    College ed    Government job    Family History  Problem Relation Age of Onset  . Heart disease Mother   . Hypertension Mother   . Hyperthyroidism Mother   . Osteoporosis Mother   . Heart disease Father   . COPD Father   . Kidney cancer Father   .  Arthritis Sister   . Depression Sister   . Hypertension Sister   . Hypothyroidism Sister   . Hyperthyroidism Brother        s/p ablation   . Stroke Maternal Grandmother   . Pneumonia Maternal Grandfather   . Cancer Paternal Grandmother        leukemia  . Heart disease Paternal Grandfather   . Cancer Brother        prostate/bladder cancer  . Breast cancer Neg Hx    Scheduled Meds: . amLODipine  10 mg Oral Daily  . dexamethasone  10 mg Intravenous Once  . [START ON 04/21/2019] dexamethasone  2 mg Oral BID WC  . enoxaparin (LOVENOX) injection  40 mg Subcutaneous QHS  . feeding supplement (ENSURE ENLIVE)  237 mL Oral BID BM  . ipratropium-albuterol  3 mL Nebulization TID  . multivitamin with minerals  1 tablet Oral Daily  . ondansetron (ZOFRAN) IV  4 mg Intravenous Q6H  . promethazine  6.25 mg Intravenous Q6H  . sodium chloride flush  3 mL Intravenous Q12H   Continuous Infusions: PRN Meds:.acetaminophen, albuterol, fluticasone, Glycerin (Adult), guaiFENesin, hydrALAZINE, morphine injection, senna-docusate, simethicone Medications Prior to Admission:  Prior to Admission medications   Medication Sig Start Date End Date Taking? Authorizing Provider  acetaminophen (TYLENOL) 500 MG tablet Take 500-1,000 mg by mouth every 6 (six) hours as needed (pain.).   Yes [provider]  amLODipine-benazepril (LOTREL) 10-20 MG capsule Take 1 capsule by mouth daily. Patient taking differently: Take 1 capsule by mouth daily. In the morning  09/29/18  Yes McLean-Scocuzza, Nino Glow, MD  atorvastatin (LIPITOR) 20 MG tablet Take 1 tablet (20 mg total) by mouth daily at 6 PM. 03/28/19  Yes McLean-Scocuzza, Nino Glow, MD  Cholecalciferol (VITAMIN D-3) 5000 units TABS Take 5,000 Units by mouth daily.    Yes [provider]  ondansetron (ZOFRAN ODT) 4 MG disintegrating tablet Take 1 tablet (4 mg total) by mouth every 8 (eight) hours as needed for nausea or vomiting. 04/07/19  Yes McLean-Scocuzza, Nino Glow, MD  promethazine (PHENERGAN) 25 MG tablet Take 0.5-1 tablets (12.5-25 mg total) by mouth every 8 (eight) hours as needed for nausea or vomiting. 04/11/19   McLean-Scocuzza, Nino Glow, MD   Allergies  Allergen Reactions  . Desyrel [Trazodone]     Paradoxical  . Diclofenac Sodium Rash  . Seasonal Ic [Cholestatin]     Sinus drainage   Review of Systems  Constitutional: Positive for activity change and appetite change.  Gastrointestinal: Positive for abdominal distention, nausea and vomiting.  Psychiatric/Behavioral: The patient is nervous/anxious.     Physical Exam Vitals signs and nursing note reviewed.  Cardiovascular:     Rate and Rhythm: Normal rate and regular rhythm.  Pulmonary:     Effort: Pulmonary effort is normal.  Abdominal:     General: There is distension.  Skin:    General: Skin is warm and dry.     Coloration: Skin is not jaundiced.  Neurological:     Mental Status: She is oriented to person, place, and time.  Psychiatric:        Thought Content: Thought content normal.     Vital Signs: BP 137/72 (BP Location: Right Arm)   Richardson 96   Temp (!) 97.4 F (36.3 C) (Oral)   Resp 16   Ht _0  (1.626 m)   Wt 121 kg   LMP  (LMP Unknown)   SpO2 94%   BMI 45.79 kg/m  Pain Scale:  0-10 POSS *See Group Information*: 1-Acceptable,Awake and alert Pain Score: Asleep   SpO2: SpO2: 94 % O2 Device:SpO2: 94 % O2 Flow Rate: .O2 Flow Rate (L/min): 2 L/min  IO: Intake/output summary:   Intake/Output Summary  (Last 24 hours) at 04/20/2019 1149 Last data filed at 04/20/2019 0836 Gross per 24 hour  Intake 50 ml  Output 900 ml  Net -850 ml    LBM: Last BM Date: 04/19/19 Baseline Weight: Weight: 116.6 kg Most recent weight: Weight: 121 kg     Palliative Assessment/Data: PPS: 50%     Thank you for this consult. Palliative medicine will continue to follow and assist as needed.   Time In: 1000 Time Out: 1200 Time Total: 120 minutes Prolonged billing time: Yes Greater than 50%  of this time was spent counseling and coordinating care related to the above assessment and plan.  Signed by: Mariana Kaufman, AGNP-C Palliative Medicine    Please contact Palliative Medicine Team phone at [phone] for questions and concerns.  For individual provider: See Shea Evans

## 2019-04-21 ENCOUNTER — Telehealth: Payer: Self-pay | Admitting: Internal Medicine

## 2019-04-21 ENCOUNTER — Other Ambulatory Visit: Payer: Self-pay | Admitting: Internal Medicine

## 2019-04-21 ENCOUNTER — Encounter: Payer: Self-pay | Admitting: Internal Medicine

## 2019-04-21 DIAGNOSIS — C799 Secondary malignant neoplasm of unspecified site: Secondary | ICD-10-CM

## 2019-04-21 NOTE — Telephone Encounter (Signed)
Patient has refused cancer treatment so she is not being followed by oncology. The director of the company stated that the hospice provider can not be attending.  If un able to be attending they will have to establish else where to receive hospice. Please advise. They cant do the verbal with out an attending.

## 2019-04-21 NOTE — Telephone Encounter (Signed)
° °  Sandra Richardson with hospice returning call to [facility]. Unable to reach office x3.   Cb# [phone]

## 2019-04-21 NOTE — Telephone Encounter (Signed)
Copied from [place] [phone]. Topic: Quick Communication - See Telephone Encounter >> Apr 21, 2019  5:54 PM Blase Mess A wrote: CRM for notification. See Telephone encounter for: 04/21/19.  Neoma Laming from [employer] is calling for Dr. Linus Orn needing to know if she will serve at the hospice attending Dr she says.  Hospice has not made an intial visit as of yet.Needing Dr. Linus Orn to say she will attend before they go out to the home and meet with the family.  Received an order today. However, unclear if Dr. Linus Orn will be the attending Dr. Neoma Laming has called 4x times today with no clear response. Neoma Laming was assured she would have a response by end of day. No response has been received by Neoma Laming. And Neoma Laming will have to meet with the patient and the family next week.  Please advise CB- [phone]

## 2019-04-21 NOTE — Telephone Encounter (Signed)
Referral hospice home

## 2019-04-24 ENCOUNTER — Telehealth: Payer: Self-pay

## 2019-04-24 NOTE — Telephone Encounter (Signed)
Copied from [place] [id number]. Topic: Quick Communication - See Telephone Encounter >> Apr 21, 2019  5:54 PM Blase Mess A wrote: CRM for notification. See Telephone encounter for: 04/21/19.  Neoma Laming from [employer] is calling for Dr. Linus Orn needing to know if she will serve at the hospice attending Dr she says.  Hospice has not made an intial visit as of yet.Needing Dr. Linus Orn to say she will attend before they go out to the home and meet with the family.  Received an order today. However, unclear if Dr. Linus Orn will be the attending Dr. Neoma Laming has called 4x times today with no clear response. Neoma Laming was assured she would have a response by end of day. No response has been received by Neoma Laming. And Neoma Laming will have to meet with the patient and the family next week.  Please advise CB- [phone]

## 2019-04-24 NOTE — Telephone Encounter (Signed)
Copied from [place] [id number]. Topic: Quick Communication - See Telephone Encounter >> Apr 21, 2019  5:54 PM Blase Mess A wrote: CRM for notification. See Telephone encounter for: 04/21/19.  Neoma Laming from [employer] is calling for Dr. Linus Orn needing to know if she will serve at the hospice attending Dr she says.  Hospice has not made an intial visit as of yet.Needing Dr. Linus Orn to say she will attend before they go out to the home and meet with the family.  Received an order today. However, unclear if Dr. Linus Orn will be the attending Dr. Neoma Laming has called 4x times today with no clear response. Neoma Laming was assured she would have a response by end of day. No response has been received by Neoma Laming. And Neoma Laming will have to meet with the patient and the family next week.  Please advise CB- [phone]

## 2019-04-24 NOTE — Telephone Encounter (Signed)
I called and spoke with Sandra Richardson and she stated she misunderstood because the hospice had came out on friday so, she will switch over to them.  Sandra Richardson,cma

## 2019-04-24 NOTE — Telephone Encounter (Signed)
I will be attending until she gets hospice physician at home to manage her then I will sign off as I typically do not manage hospice patients at home and let hospice attendings do this  Inform deborah again as we spoke to her about this on Friday of last week   Melissa, Where are we on hospice home referral ?

## 2019-04-24 NOTE — Telephone Encounter (Signed)
What does this note mean?

## 2019-04-26 ENCOUNTER — Telehealth: Payer: Self-pay

## 2019-04-27 ENCOUNTER — Telehealth: Payer: Self-pay

## 2019-04-27 NOTE — Telephone Encounter (Signed)
I was just stting Sandra Richardson misunderstood that you did not care for your pt after hospice, that you will do it until the switch with the hospice physician, so it was all handled.

## 2019-04-27 NOTE — Telephone Encounter (Signed)
Copied from [place] [phone]. Topic: General - Deceased Patient >> 2019-05-24  9:26 AM Berneta Levins wrote: Reason for CRM:   Crystal from Hospice calling to report pt's death.  Pt passed 05/24/2019 at 5:07am. Crystal can be reached at [phone]  Route to department's PEC Pool.

## 2019-05-01 ENCOUNTER — Other Ambulatory Visit: Payer: Managed Care, Other (non HMO)

## 2019-05-02 ENCOUNTER — Other Ambulatory Visit: Payer: Managed Care, Other (non HMO)

## 2019-05-02 ENCOUNTER — Telehealth: Payer: Self-pay | Admitting: Internal Medicine

## 2019-05-02 NOTE — Telephone Encounter (Signed)
Given to dr Olivia Mackie

## 2019-05-02 NOTE — Telephone Encounter (Signed)
[employer] dropped off pt death certificate to be filled out  Please call [phone] when completed  Placed in Dr. Aundra Dubin- Jacklynn Lewis color folder upfront

## 2019-05-03 ENCOUNTER — Telehealth: Payer: Self-pay

## 2019-05-03 NOTE — Telephone Encounter (Signed)
Called [employer] and [employer].  Spoke with Caren Griffins.  Informed Caren Griffins that death certificate for patient is ready for pick up.  [facility].  Caren Griffins said that someone from the funeral services would come to office to pick up certificate.  Left certificate at front office for pick up.  Made copy for patient's file.

## 2019-05-05 ENCOUNTER — Ambulatory Visit: Admit: 2019-05-05 | Payer: Managed Care, Other (non HMO) | Admitting: Obstetrics and Gynecology

## 2019-05-05 SURGERY — SALPINGO-OOPHORECTOMY, BILATERAL, LAPAROSCOPIC
Anesthesia: Choice | Laterality: Bilateral

## 2019-05-10 NOTE — Telephone Encounter (Signed)
Called Sandra Richardson to express condolences on death of her aunt  Sandra Richardson pt as deceased please   [place]

## 2019-05-10 NOTE — Telephone Encounter (Signed)
Patient has been marked as deceased

## 2019-05-10 NOTE — Telephone Encounter (Signed)
Copied from [place] [id number]. Topic: General - Deceased Patient >> 24-May-2019  9:26 AM Berneta Levins wrote: Reason for CRM:   Crystal from Hospice calling to report pt's death.  Pt passed May 24, 2019 at 5:07am. Crystal can be reached at [phone]  Route to department's PEC Pool.

## 2019-05-10 DEATH — deceased

## 2019-05-24 ENCOUNTER — Ambulatory Visit: Payer: Self-pay | Admitting: Internal Medicine

## 2019-06-02 DIAGNOSIS — Z515 Encounter for palliative care: Secondary | ICD-10-CM

## 2019-06-02 DIAGNOSIS — Z7189 Other specified counseling: Secondary | ICD-10-CM

## 2019-12-31 IMAGING — CR PORTABLE CHEST - 1 VIEW
1 series · 2 of 2 positions shown · non-contrast
Comparison: None.

CLINICAL DATA: Vomiting.

EXAM:
PORTABLE CHEST 1 VIEW

[Series 1: portable · 0.17mm/px · 2 of 2 slices shown]
[im 1/2]
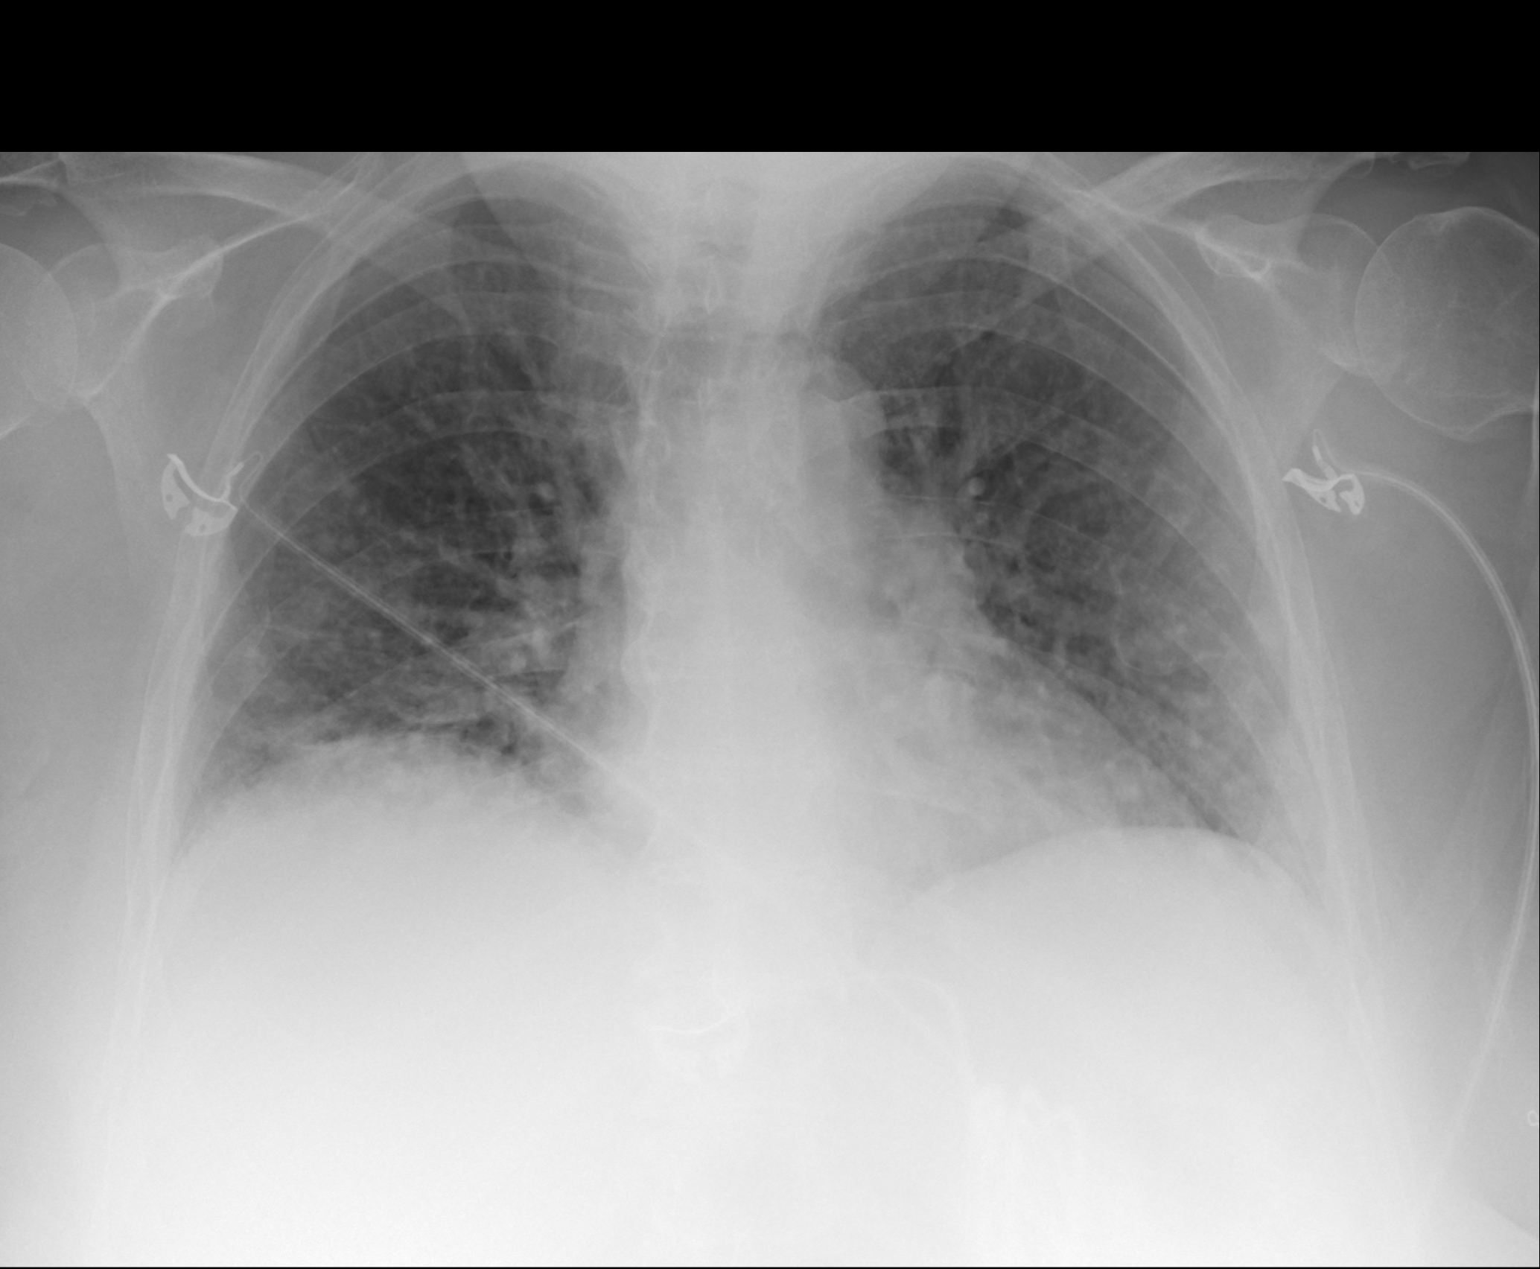
[im 2/2]
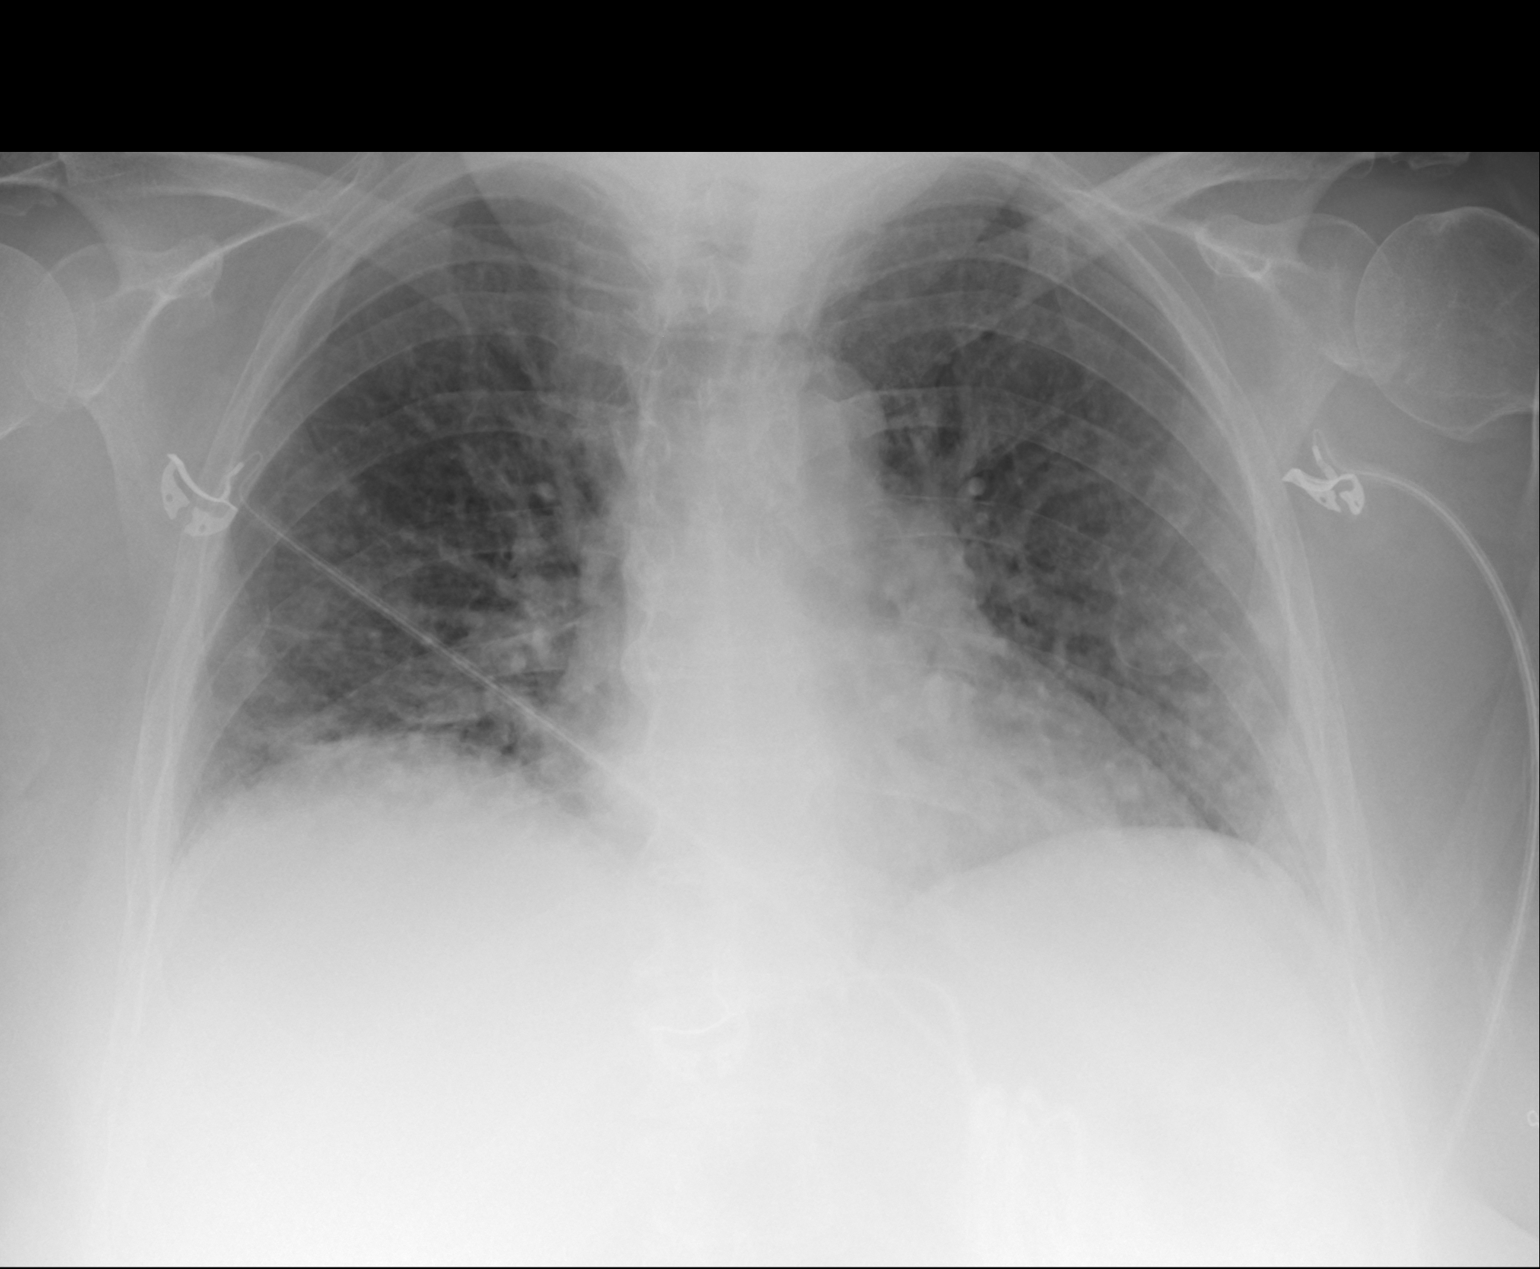

[2 of 2 positions shown; findings below may reference images not displayed]

FINDINGS: The heart size is enlarged. The lung volumes are low. There are
prominent interstitial lung markings bilaterally. There is likely
atelectasis or scarring at the lung bases. No acute osseous
abnormality. No pneumothorax.
IMPRESSION: Mild cardiac enlargement with low lung volumes. Airspace opacity at
the right lung base is favored to represent atelectasis.

## 2020-01-05 IMAGING — DX CHEST - 2 VIEW
2 series · 2 of 2 positions shown · non-contrast
Comparison: April 13, 2019 chest radiograph and chest CT April 12, 2019

CLINICAL DATA: Respiratory failure

EXAM:
CHEST - 2 VIEW

[chest lat]
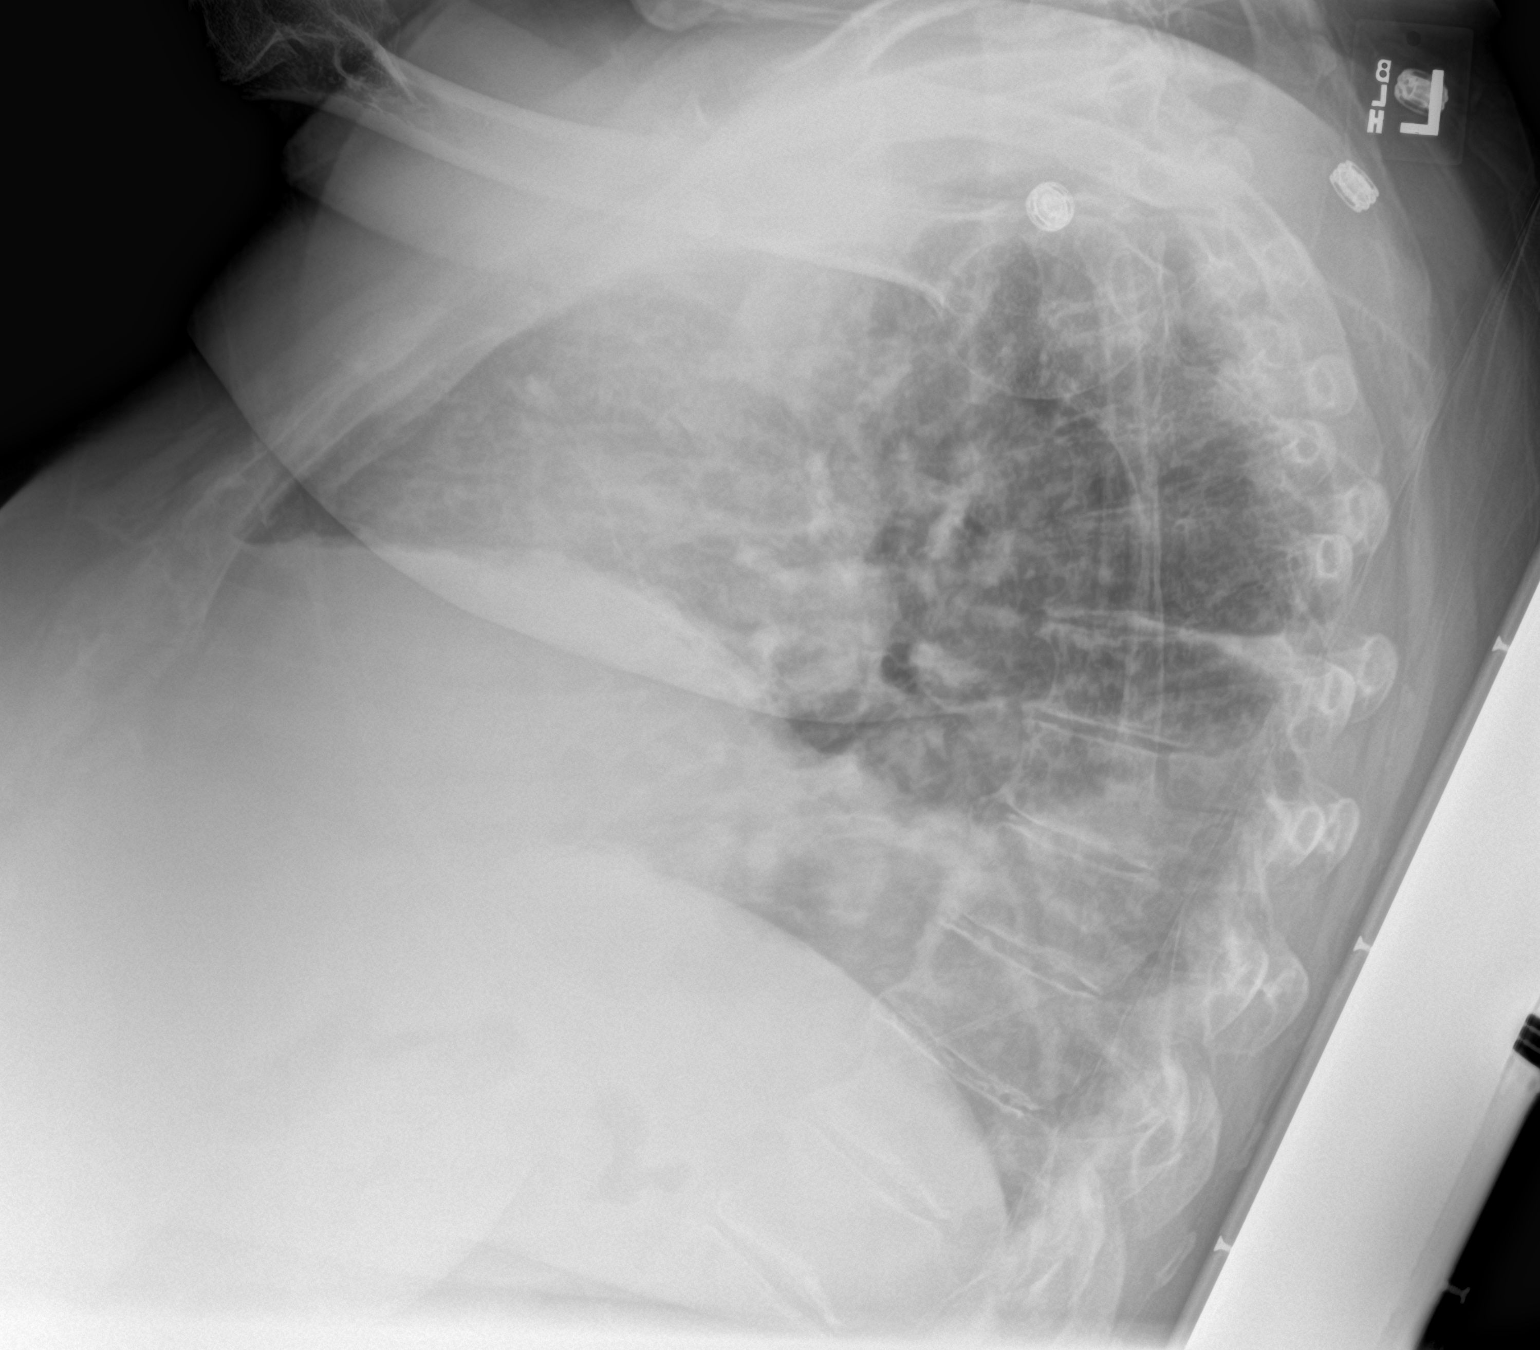

[chest ap]
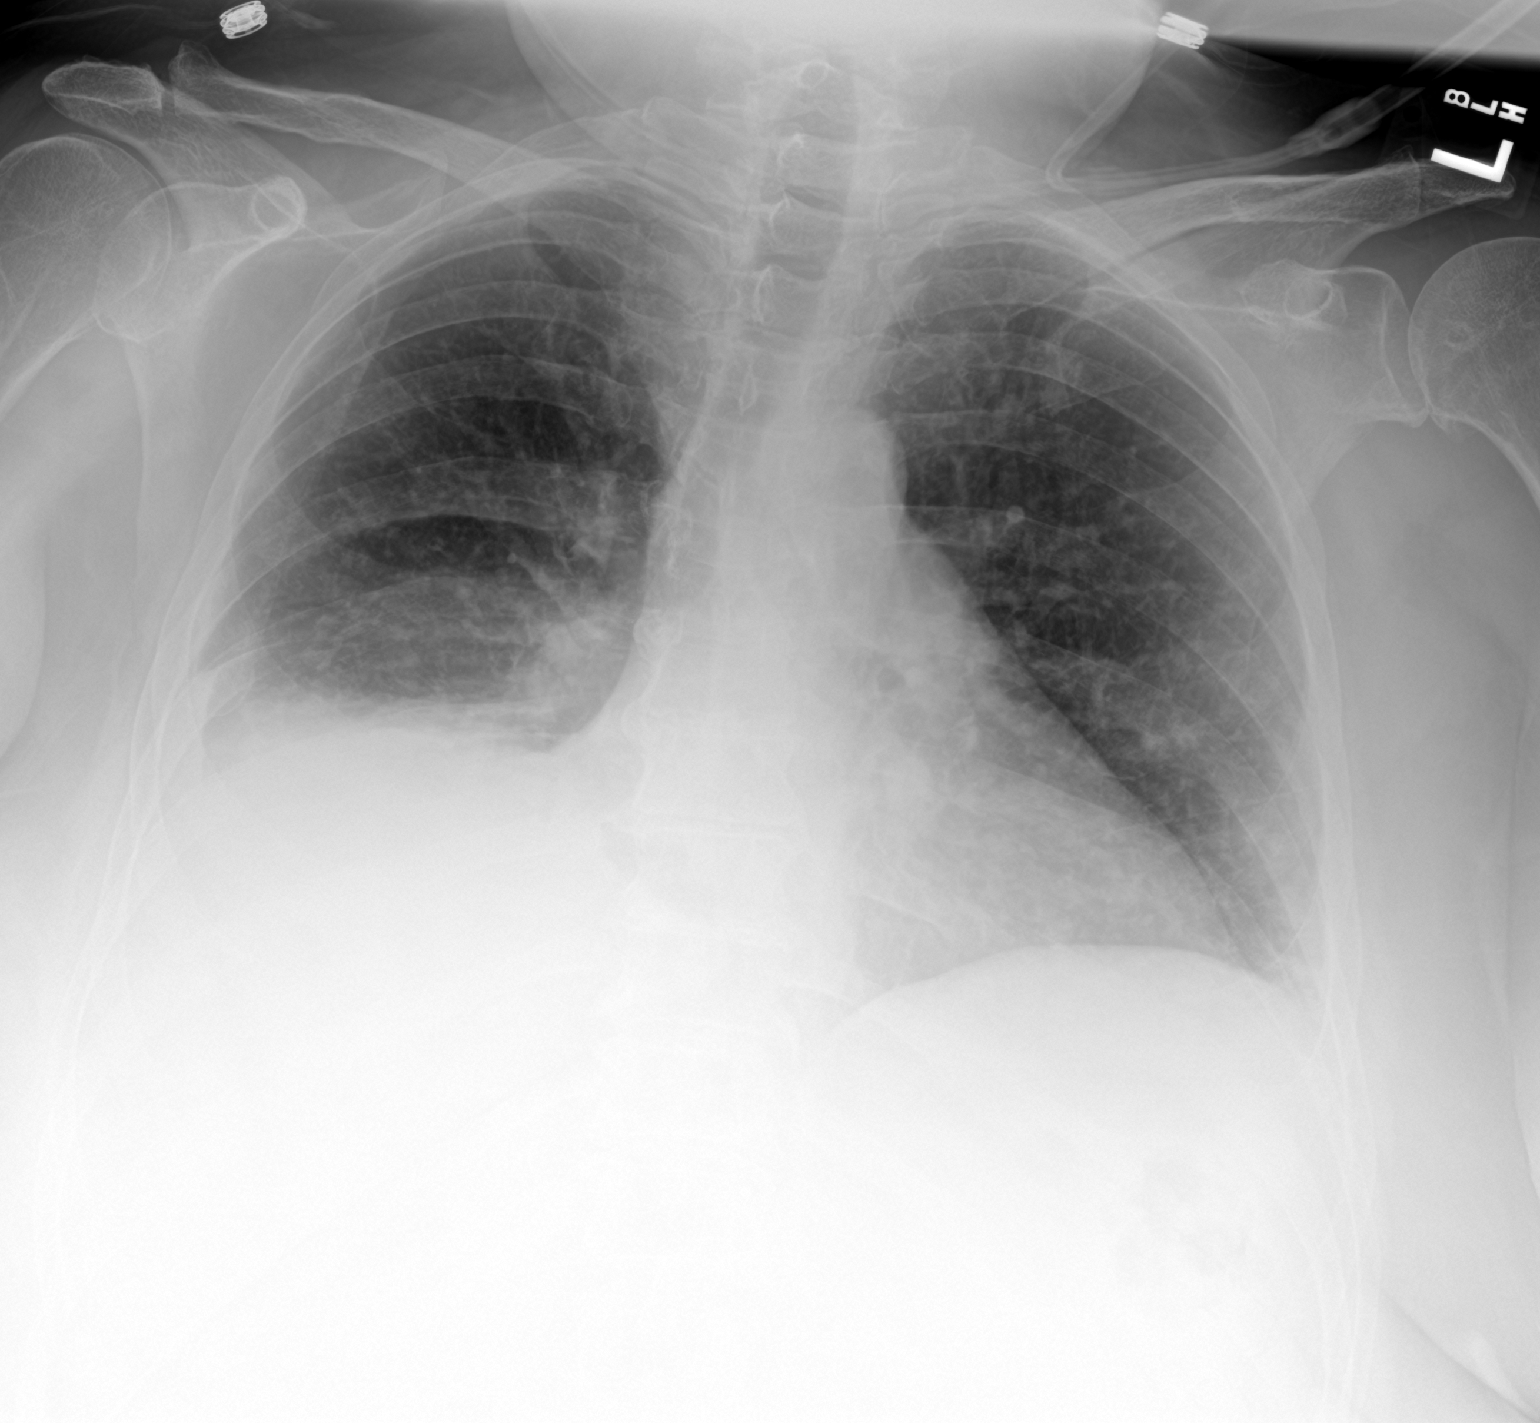

[2 of 2 positions shown; findings below may reference images not displayed]

FINDINGS: There is a fairly small pleural effusion on the right with right
base atelectasis and airspace consolidation. Pulmonary nodular
lesions are seen throughout the lungs, better delineated on recent
CT. Heart size and pulmonary vascularity are normal. No adenopathy
demonstrable. No bone lesions.
IMPRESSION: Fairly small right pleural effusion with atelectasis and
consolidation in the right base region. Suspect pneumonia in the
right base.

Small pulmonary nodular lesions throughout the lungs diffusely are
noted, better seen on recent CT.

Cardiac silhouette is within normal limits. No adenopathy is
demonstrable by radiography.
